# Patient Record
Sex: Male | Born: 1960 | Race: White | Hispanic: No | Marital: Married | Smoking: Never smoker
Health system: Southern US, Community
[De-identification: ages and names within clinical notes are randomized; demographics above are authoritative.]

## PROBLEM LIST (undated history)

## (undated) DIAGNOSIS — Z1211 Encounter for screening for malignant neoplasm of colon: Secondary | ICD-10-CM

## (undated) DIAGNOSIS — H269 Unspecified cataract: Secondary | ICD-10-CM

## (undated) DIAGNOSIS — I2510 Atherosclerotic heart disease of native coronary artery without angina pectoris: Secondary | ICD-10-CM

## (undated) DIAGNOSIS — Z8249 Family history of ischemic heart disease and other diseases of the circulatory system: Secondary | ICD-10-CM

## (undated) DIAGNOSIS — K649 Unspecified hemorrhoids: Secondary | ICD-10-CM

## (undated) DIAGNOSIS — N200 Calculus of kidney: Secondary | ICD-10-CM

## (undated) DIAGNOSIS — K219 Gastro-esophageal reflux disease without esophagitis: Secondary | ICD-10-CM

## (undated) DIAGNOSIS — J302 Other seasonal allergic rhinitis: Secondary | ICD-10-CM

## (undated) DIAGNOSIS — R5381 Other malaise: Secondary | ICD-10-CM

## (undated) DIAGNOSIS — R040 Epistaxis: Secondary | ICD-10-CM

## (undated) DIAGNOSIS — R002 Palpitations: Secondary | ICD-10-CM

## (undated) HISTORY — DX: Encounter for screening for malignant neoplasm of colon: Z12.11

## (undated) HISTORY — DX: Atherosclerotic heart disease of native coronary artery without angina pectoris: I25.10

## (undated) HISTORY — DX: Family history of ischemic heart disease and other diseases of the circulatory system: Z82.49

## (undated) HISTORY — DX: Other malaise: R53.81

## (undated) HISTORY — DX: Unspecified hemorrhoids: K64.9

## (undated) HISTORY — PX: JOINT REPLACEMENT: SHX530

## (undated) HISTORY — DX: Calculus of kidney: N20.0

## (undated) HISTORY — DX: Palpitations: R00.2

## (undated) HISTORY — DX: Gastro-esophageal reflux disease without esophagitis: K21.9

## (undated) HISTORY — DX: Epistaxis: R04.0

## (undated) HISTORY — DX: Unspecified cataract: H26.9

## (undated) HISTORY — PX: CYSTOSCOPY: SUR368

## (undated) HISTORY — PX: CHOLECYSTECTOMY: SHX55

## (undated) HISTORY — DX: Other seasonal allergic rhinitis: J30.2

---

## 2000-03-30 HISTORY — PX: OTHER SURGICAL HISTORY: SHX169

## 2002-12-19 ENCOUNTER — Inpatient Hospital Stay (HOSPITAL_COMMUNITY): Admission: RE | Admit: 2002-12-19 | Discharge: 2002-12-23 | Payer: Self-pay | Admitting: Orthopaedic Surgery

## 2003-03-31 HISTORY — PX: OTHER SURGICAL HISTORY: SHX169

## 2005-01-01 ENCOUNTER — Inpatient Hospital Stay (HOSPITAL_COMMUNITY): Admission: RE | Admit: 2005-01-01 | Discharge: 2005-01-04 | Payer: Self-pay | Admitting: Orthopaedic Surgery

## 2008-11-28 ENCOUNTER — Emergency Department (HOSPITAL_COMMUNITY): Admission: EM | Admit: 2008-11-28 | Discharge: 2008-11-29 | Payer: Self-pay | Admitting: Emergency Medicine

## 2008-12-01 ENCOUNTER — Encounter (INDEPENDENT_AMBULATORY_CARE_PROVIDER_SITE_OTHER): Payer: Self-pay | Admitting: General Surgery

## 2008-12-01 ENCOUNTER — Observation Stay (HOSPITAL_COMMUNITY): Admission: RE | Admit: 2008-12-01 | Discharge: 2008-12-02 | Payer: Self-pay | Admitting: General Surgery

## 2010-01-09 ENCOUNTER — Ambulatory Visit (HOSPITAL_COMMUNITY): Admission: RE | Admit: 2010-01-09 | Discharge: 2010-01-09 | Payer: Self-pay | Admitting: Gastroenterology

## 2010-07-04 LAB — URINALYSIS, ROUTINE W REFLEX MICROSCOPIC
Ketones, ur: 15 mg/dL — AB
Nitrite: NEGATIVE
Specific Gravity, Urine: 1.027 (ref 1.005–1.030)
Urobilinogen, UA: 0.2 mg/dL (ref 0.0–1.0)
pH: 5.5 (ref 5.0–8.0)

## 2010-07-04 LAB — COMPREHENSIVE METABOLIC PANEL
ALT: 20 U/L (ref 0–53)
AST: 21 U/L (ref 0–37)
Albumin: 4 g/dL (ref 3.5–5.2)
Alkaline Phosphatase: 49 U/L (ref 39–117)
GFR calc Af Amer: >60 mL/min (ref >60)
Glucose, Bld: 149 mg/dL — ABNORMAL HIGH (ref 70–99)
Potassium: 4.3 mEq/L (ref 3.5–5.1)
Sodium: 137 mEq/L (ref 135–145)
Total Protein: 6.5 g/dL (ref 6.0–8.3)

## 2010-07-04 LAB — DIFFERENTIAL
Basophils Relative: 0 % (ref 0–1)
Eosinophils Absolute: 0 10*3/uL (ref 0.0–0.7)
Eosinophils Relative: 0 % (ref 0–5)
Lymphs Abs: 0.9 10*3/uL (ref 0.7–4.0)
Monocytes Absolute: 0.4 10*3/uL (ref 0.1–1.0)
Monocytes Relative: 4 % (ref 3–12)
Neutrophils Relative %: 87 % — ABNORMAL HIGH (ref 43–77)

## 2010-07-04 LAB — CBC
Hemoglobin: 14.8 g/dL (ref 13.0–17.0)
RBC: 4.69 MIL/uL (ref 4.22–5.81)
RDW: 12.8 % (ref 11.5–15.5)

## 2010-07-04 LAB — POCT CARDIAC MARKERS: Troponin i, poc: <0.05 ng/mL (ref 0.00–0.09)

## 2010-08-15 NOTE — Discharge Summary (Signed)
NAME:  David Ryan, David Ryan                        ACCOUNT NO.:  [id number]   MEDICAL RECORD NO.:  [id number]                   PATIENT TYPE:  INP   LOCATION:  5024                                 FACILITY:  [facility]   PHYSICIAN:  Lubertha Basque. Jerl Santos, M.D.             DATE OF BIRTH:  1960-09-30   DATE OF ADMISSION:  12/19/2002  DATE OF DISCHARGE:  12/23/2002                                 DISCHARGE SUMMARY   ADMISSION DIAGNOSIS:  Right hip end-stage degenerative joint disease.   DISCHARGE DIAGNOSIS:  Right hip end-stage degenerative joint disease.   OPERATION:  Right total hip replacement.   BRIEF HISTORY:  [name] is a 50 year old male who has had multiple months  of right hip pain. He is know having pain with every step and is having  trouble sleeping at night time, trying to get comfortable. He has tried  various nonsteroidal anti-inflammatory medications; the last one, Naprosyn,  and it has not been of benefit. He has had increasing pain. His x-rays  revealed end-stage DJD and complete destruction of his right hip joint.   PERTINENT LABORATORY DATA AND X-RAY FINDINGS:  His last hemoglobin was 11.5,  hematocrit 33.3. INR 2.0. Serial pro times were done as he was on low dose  Coumadin protocol for prevention.   COURSE IN THE HOSPITAL:  He was admitted postoperatively, put on a PCA pump  for pain control, IV Ancef 1 g q.8h. for three doses. Pharmacy was asked to  help regulate his Coumadin level with an INR greater than 2 and lower than  3. He was also put on Lovenox subcu until his INR was appropriate. Physical  therapy was ordered for touchdown weight-bearing as he had an ingrowth  prosthesis. He had his dressing changed the second day postoperative, and  wound was benign. Progressed well with therapy and really had an  uncomplicated hospital stay. [facility] had been contacted for home health  services which would include pro times and physical therapy. He was  discharged home.   CONDITION ON DISCHARGE:  Improved.   FOLLOW UP:  He will remain on Coumadin for 30 days. He started off on 2.5 mg  daily, and that will be changed as his pro times change.  1. Percocet for pain one or two q.4-6h.  2. Skelaxin 400 mg p.o. q.6-8h. for muscle spasm.   ACTIVITY:  Touch down weight bearing with crutches.   DIET:  Unrestricted.    WOUND CARE:  He may change the dressing every day as desires, and [facility] agency will send a nurse to draw pro times and work  physical therapy.   FOLLOW UP:  He will routine to our office within one week.      [name], P.A.                    Lubertha Basque Jerl Santos, M.D.    MC/MEDQ  D:  01/08/2003  T:  01/08/2003  Job:  [id number]

## 2010-08-15 NOTE — Discharge Summary (Signed)
NAME:  David Ryan, David Ryan              ACCOUNT NO.:  [id number]   MEDICAL RECORD NO.:  [id number]          PATIENT TYPE:  INP   LOCATION:  5010                         FACILITY:  [facility]   PHYSICIAN:  David Basque. Dalldorf, M.D.DATE OF BIRTH:  [birth year]   DATE OF ADMISSION:  01/01/2005  DATE OF DISCHARGE:  01/04/2005                                 DISCHARGE SUMMARY   ADMISSION DIAGNOSIS:  End stage degenerative joint disease left hip.   OPERATION:  Left total hip replacement.   HISTORY OF PRESENT ILLNESS:  David Ryan is a 50 year old white male who is a  patient well known to our practice. He has had his right hip replaced some  time ago and his left hip now is continuing to bother him. He is having pain  with every step and stiffness in the morning and trouble getting up and down  stairs. Trouble sleeping at night time and his x-rays reveal end stage  degenerative joint disease of his left hip.   LABORATORY DATA:  WBC 8.9, hemoglobin 10.5, hematocrit 29.8, platelets  164,000. Serial pro-times, as he was on low-dose Coumadin protocol INR of  3.4, sodium 139, potassium 4.2, glucose 104, BUN 6, creatinine 1.2, calcium  8.1.   HOSPITAL COURSE:  He was admitted postoperatively and was kept on an IV of  lactated ringers 80 cc per hour, morphine PCA pump, Ancef a gram q.8 x3  doses, Colace, Senokot, Trinsicon, laxative of choice all ordered. Coumadin  and Lovenox protocol for DVT prophylaxis per pharmacy. Percocet and  Phenergan by mouth as well for pain control and nausea. Hip precautions.  Incentive spirometry. Knee high ted's. Bed with an overhead frame. Knee  immobilizer to the left knee while in bed. Up and off while walking. CBC, B-  met, and daily pro-times per standing orders x2 days each. Pro-times drawn  serially for DVT prophylaxis protocol per pharmacy. He was ordered to be  touch down weight bearing on that side. The first day postoperative, his  vital signs were stable. His  temperature was 100.2. Blood pressure was  slightly decreased. Lungs were clear. Abdomen was soft. Dressing was dry.  Good neurovascular status. A PCA was holding him. Foley catheter was in and  would be continued until January 03, 2005. Physical therapy was ordered for  touch down weight bearing gait. The second day postoperative, his vital  signs were stable. Temperature was down to 98. Dressing was dry and changed.  Neurovascularly, he was intact with no sign of infection. He continued to  progress well and was discharged home on day 3.   CONDITION ON DISCHARGE:  Improved.   DIET:  No restrictions.   ACTIVITY:  He cannot drive for 2 to 3 weeks. Increase activity slowly. He is  to be partial weight bearing.   DISCHARGE MEDICATIONS:  Prescription for Tylox was given 1 or 2 every 4  hours as needed for pain. Coumadin dose per pharmacy starting off at 3 mg.  Prescription given for Tylox and Coumadin, both upon his discharge.   FOLLOW UP:  Call for an appointment for Dr. Jerl Ryan  in about 10 days at [phone]. If any problems or signs of infection, to call the same number.   SPECIAL INSTRUCTIONS:  Home physical therapy and INR/pro-times were going to  be regulated by [facility].      David Ryan, P.A.      David Ryan, M.D.  Electronically Signed    MC/MEDQ  D:  02/17/2005  T:  02/17/2005  Job:  [id number]

## 2010-08-15 NOTE — Op Note (Signed)
NAME:  NEIKO, TRIVEDI                        ACCOUNT NO.:  [id number]   MEDICAL RECORD NO.:  [id number]                   PATIENT TYPE:  INP   LOCATION:  NA                                   FACILITY:  [facility]   PHYSICIAN:  Lubertha Basque. Jerl Santos, M.D.             DATE OF BIRTH:  [birth year]   DATE OF PROCEDURE:  12/19/2002  DATE OF DISCHARGE:                                 OPERATIVE REPORT   PREOPERATIVE DIAGNOSIS:  Right hip degenerative arthritis.   POSTOPERATIVE DIAGNOSIS:  Right hip degenerative arthritis.   PROCEDURE:  Right total hip replacement.   ANESTHESIA:  General.   SURGEON:  Lubertha Basque. Jerl Santos, M.D.   ASSISTANT:  [name], P.A.   INDICATIONS FOR PROCEDURE:  The patient is a 50 year old man with a long  history of a  very painful right  hip. This has persisted despite various  oral anti-inflammatories and activity restriction. He  has  end-stage  degeneration on x-rays and has pain which limits  his  ability to  sleep and  be active. He  is offered a hip replacement procedure. Informed operative  consent was obtained after a discussion  of possible  complications of  reaction to anesthesia, infection, DVT, PE, dislocation and death.   DESCRIPTION OF PROCEDURE:  The patient was taken to the operating suite  where general anesthetic was applied without difficulty. He was positioned  in the lateral decubitus position  and all bony prominences were  appropriately padded. An axillary roll was placed and hip positioners were  used. He was prepped and draped in the normal sterile fashion. After  administration of preoperative IV antibiotics a posterior approach was taken  to the right hip through  a relatively  small  incision. All appropriate  anti infective measures were used including Betadine impregnated drape,  preoperative IV antibiotic and closed hooded  exhaust systems for each  member of the surgical team.   A dissection was taken through a paucity of  adipose tissue  to expose the IT  band and gluteus maximus fascia. These  were incised longitudinally to  expose the short external rotators of the hip which were tagged and  reflected. A posterior capsulectomy was performed. The hip was dislocated.   A femoral neck cut was made distal  to the lesser trochanter. He had severe  degenerative change but excellent bone quality. Some labral structures were  removed from around the hip followed  by reaming. He had a fairly  large  medial osteophyte in the acetabulum and a small reamer  was used to take  this down to the medial wall. The  reamer set was then  used to  sequentially enlarge the acetabulum to 53 mm. He had good bone which was  bleeding around most of the acetabulum at this point.   A size 54 Pinnacle cup was then placed. This seated fully and was quite  stable. We placed  this in appropriate anteversion and  tilt. A trial liner  was placed. Attention was turned toward the femur. This was reamed and  broached appropriately to accept a size 7 component. A trial reduction was  done with various  components and the high offset plus 5 assembly was felt  to be most stable.   Then the trial femoral component was removed followed  by placement  of a  size 7 high offset Summit femoral  component. This  seated  fully. The  acetabulum was exposed. A hole illuminator was placed followed by placement  of  the Ultramet metal insert. A size 36 plus 5 Articuleze femoral head was  placed on the dry femoral stem which had been placed in appropriate  anteversion.   The hip was then reduced. There was minimal finish shuck. The hip was stable  in extension with external rotation and flexion with internal rotation. Leg  lengths were  judged to be roughly equal. The wound was irrigated, followed  by reapproximation of the short external rotators to the greater  trochanteric area with nonabsorbable suture.   The wound was again irrigated, followed   by closure of the IT band and the  gluteus maximus  fascia with Ethibond and the subcutaneous tissues with 0  and  2-0 undyed Vicryl. The skin was closed with staples followed by  injection with some Marcaine. Adaptic was applied to the wound followed  by  dry gauze and tape. Estimated blood loss was  and interoperative fluids can  be obtained from the  anesthesia records.   Disposition, the patient was extubated  in the operating room and taken to  the recovery room in stable condition. Plans were for him to be admitted to  the orthopedic surgery service for appropriate postoperative  care  to  include perioperative antibiotics and Coumadin  plus Lovenox for DVT  prophylaxis.                                               Lubertha Basque Jerl Santos, M.D.    PGD/MEDQ  D:  12/19/2002  T:  12/19/2002  Job:  [id number]

## 2010-08-15 NOTE — Op Note (Signed)
NAME:  David Ryan, David Ryan              ACCOUNT NO.:  [id number]   MEDICAL RECORD NO.:  [id number]          PATIENT TYPE:  INP   LOCATION:  NA                           FACILITY:  [facility]   PHYSICIAN:  Lubertha Basque. Dalldorf, M.D.DATE OF BIRTH:  [birth year]   DATE OF PROCEDURE:  01/01/2005  DATE OF DISCHARGE:                                 OPERATIVE REPORT   PREOPERATIVE DIAGNOSIS:  Left hip degenerative arthritis.   POSTOPERATIVE DIAGNOSIS:  Left hip degenerative arthritis.   PROCEDURE:  Left total hip replacement.   ANESTHESIA:  General.   ATTENDING SURGEON:  Lubertha Basque. Jerl Santos, M.D.   ASSISTANT:  [name], P.A.   INDICATIONS FOR PROCEDURE:  The patient is a 50 year old man with a long  history of bilateral hip degenerative arthritis.  He is status post  successful hip replacement on the opposite side a couple of years ago and at  this point, has pain which limits his ability to remain active and to rest  on the left side.  He has end-stage degeneration by x-ray.  He has failed  oral anti-inflammatories and he is offered a hip replacement procedure.  Informed operative consent was obtained after discussion of possible  complications of reaction to anesthesia, infection, DVT, dislocation, PE,  and death.   DESCRIPTION OF PROCEDURE:  The patient was taken to an operating suite where  a general anesthetic was applied without difficulty.  He was position in the  lateral decubitus position with left hip up.  All bony prominences were  appropriately padded and an axillary roll was placed.  Hip positioners were  utilized.  He was then prepped and draped in normal sterile fashion.  After  the administration of preop antibiotic, a posterior approach was taken to  the left hip.  All appropriate anti-infective measures were used including  the preoperative IV antibiotic, Betadine-impregnated drape, and closed  hooded exhaust systems for each member of the surgical team.  Dissection was  carried down through a paucity of adipose tissue to the gluteus maximus and  IT band.  These were incised longitudinally to expose the short external  rotators of the hip, which were tagged and reflected.  A posterior  capsulectomy was performed.  The hip was dislocated.  A femoral neck cut was  made just above the lesser trochanter.  The head was severely deformed.  He  had excellent bone quality in the acetabulum and femur.  We medialized with  reaming of the acetabulum to the inner wall and then sequentially reamed out  to 53, followed by placement of a size 54 Pinnacle DePuy cup.  We placed a  trial liner.  Attention was turned towards the femur.  This was reamed and  broached up to a 7, followed by a trial reduction with a 7 offset component  and a +5 head and neck assembly which was equal to the opposite side.  This  seemed a bit too tight and we backed off to a +1.5 head and neck assembly.  This was stable in extension with external rotation and flexion with  internal rotation.  There  was really no shuck.  His leg lengths were felt to  be about equal at this point.  The trial components were removed.  A hole  eliminator was placed in the acetabular shell followed by placement of the  metal liner.  We then placed a size 7 high-offset Summit component and  topped this with a 36 +1.5 metal ball.  The hip again reduced and was stable  in aforementioned positions.  The wound was irrigated, followed by  reapproximation of the short external rotators to the greater trochanteric  region with nonabsorbable suture.  IT band and gluteus maximus fascia were  closed with Vicryl, followed by subcutaneous reapproximation with 0 and 2-0  undyed Vicryl and skin closure with staples.  Adaptic was placed on the  wound followed by dry gauze and tape.  Estimated blood loss and  intraoperative fluids can be obtained from anesthesia records.   DISPOSITION:  The patient was extubated in the operating room  and taken to  the recovery room in stable condition.  Plans were for him to be admitted to  the Orthopedic Surgery Service for appropriate postop care to include  perioperative antibiotics and Coumadin plus Lovenox for DVT prophylaxis.      Lubertha Basque Jerl Santos, M.D.  Electronically Signed     PGD/MEDQ  D:  01/01/2005  T:  01/01/2005  Job:  [id number]

## 2013-05-17 ENCOUNTER — Encounter (HOSPITAL_COMMUNITY): Payer: Self-pay | Admitting: Emergency Medicine

## 2013-05-17 ENCOUNTER — Emergency Department (HOSPITAL_COMMUNITY)
Admission: EM | Admit: 2013-05-17 | Discharge: 2013-05-17 | Disposition: A | Discharge status: Home / Self Care | Payer: BC Managed Care – PPO | Attending: Emergency Medicine | Admitting: Emergency Medicine

## 2013-05-17 DIAGNOSIS — Z8679 Personal history of other diseases of the circulatory system: Secondary | ICD-10-CM | POA: Insufficient documentation

## 2013-05-17 DIAGNOSIS — R197 Diarrhea, unspecified: Secondary | ICD-10-CM | POA: Insufficient documentation

## 2013-05-17 DIAGNOSIS — R112 Nausea with vomiting, unspecified: Secondary | ICD-10-CM | POA: Insufficient documentation

## 2013-05-17 DIAGNOSIS — Z79899 Other long term (current) drug therapy: Secondary | ICD-10-CM | POA: Insufficient documentation

## 2013-05-17 DIAGNOSIS — Z9089 Acquired absence of other organs: Secondary | ICD-10-CM | POA: Insufficient documentation

## 2013-05-17 LAB — CBC WITH DIFFERENTIAL/PLATELET
BASOS PCT: 0 % (ref 0–1)
Basophils Absolute: 0 10*3/uL (ref 0.0–0.1)
EOS ABS: 0 10*3/uL (ref 0.0–0.7)
Eosinophils Relative: 0 % (ref 0–5)
HCT: 45.2 % (ref 39.0–52.0)
Hemoglobin: 16 g/dL (ref 13.0–17.0)
Lymphocytes Relative: 5 % — ABNORMAL LOW (ref 12–46)
Lymphs Abs: 0.5 10*3/uL — ABNORMAL LOW (ref 0.7–4.0)
MCH: 32 pg (ref 26.0–34.0)
MCHC: 35.4 g/dL (ref 30.0–36.0)
MCV: 90.4 fL (ref 78.0–100.0)
MONOS PCT: 4 % (ref 3–12)
Monocytes Absolute: 0.4 10*3/uL (ref 0.1–1.0)
NEUTROS PCT: 92 % — AB (ref 43–77)
Neutro Abs: 9.8 10*3/uL — ABNORMAL HIGH (ref 1.7–7.7)
PLATELETS: 236 10*3/uL (ref 150–400)
RBC: 5 MIL/uL (ref 4.22–5.81)
RDW: 12.6 % (ref 11.5–15.5)
WBC: 10.7 10*3/uL — ABNORMAL HIGH (ref 4.0–10.5)

## 2013-05-17 LAB — COMPREHENSIVE METABOLIC PANEL
ALBUMIN: 4.2 g/dL (ref 3.5–5.2)
ALK PHOS: 57 U/L (ref 39–117)
ALT: 15 U/L (ref 0–53)
AST: 20 U/L (ref 0–37)
BUN: 21 mg/dL (ref 6–23)
CO2: 23 mEq/L (ref 19–32)
Calcium: 9.6 mg/dL (ref 8.4–10.5)
Chloride: 106 mEq/L (ref 96–112)
Creatinine, Ser: 1.39 mg/dL — ABNORMAL HIGH (ref 0.50–1.35)
GFR calc Af Amer: 65 mL/min — ABNORMAL LOW (ref >90)
GFR calc non Af Amer: 56 mL/min — ABNORMAL LOW (ref >90)
Glucose, Bld: 143 mg/dL — ABNORMAL HIGH (ref 70–99)
POTASSIUM: 4.5 meq/L (ref 3.7–5.3)
SODIUM: 143 meq/L (ref 137–147)
TOTAL PROTEIN: 6.9 g/dL (ref 6.0–8.3)
Total Bilirubin: 1.1 mg/dL (ref 0.3–1.2)

## 2013-05-17 LAB — LIPASE, BLOOD: LIPASE: 24 U/L (ref 11–59)

## 2013-05-17 MED ORDER — ONDANSETRON 4 MG PO TBDP: 4.0000 mg | ORAL_TABLET | Freq: Three times a day (TID) | ORAL | Status: DC | PRN

## 2013-05-17 MED ORDER — MORPHINE SULFATE 4 MG/ML IJ SOLN
4.0000 mg | Freq: Once | INTRAMUSCULAR | Status: AC
  Administered 2013-05-17: 4 mg via INTRAVENOUS
  Filled 2013-05-17: qty 1

## 2013-05-17 MED ORDER — DICYCLOMINE HCL 20 MG PO TABS: 20.0000 mg | ORAL_TABLET | Freq: Two times a day (BID) | ORAL | Status: DC

## 2013-05-17 MED ORDER — METOCLOPRAMIDE HCL 5 MG/ML IJ SOLN
10.0000 mg | INTRAMUSCULAR | Status: AC
  Administered 2013-05-17: 10 mg via INTRAVENOUS
  Filled 2013-05-17: qty 2

## 2013-05-17 MED ORDER — SODIUM CHLORIDE 0.9 % IV BOLUS (SEPSIS)
1000.0000 mL | Freq: Once | INTRAVENOUS | Status: AC
  Administered 2013-05-17: 1000 mL via INTRAVENOUS

## 2013-05-17 NOTE — ED Notes (Signed)
Per EMS pain c/o of lower abdominal pain; Pain was sudden w/ diarrhea and n/v; Pt took meds for nausea with no relief. Diarrhea x 8 and n/v x 6 since 4:30pm last evening. Pt has hx hip replacement x 2 and gall bladder removed.   Hx of a-fib untreated but monitored. Pt received Zofran 4mg  and 250 bolus n/s.

## 2013-05-17 NOTE — ED Provider Notes (Signed)
CSN: [id number]     Arrival date & time 05/17/13  0015 History   First MD Initiated Contact with Patient 05/17/13 0025     Chief Complaint  Patient presents with  . Abdominal Pain     (Consider location/radiation/quality/duration/timing/severity/associated sxs/prior Treatment) Patient is a 53 y.o. male presenting with abdominal pain. The history is provided by the patient and medical records.  Abdominal Pain Associated symptoms: diarrhea, nausea and vomiting    This is a 53 year old male with past medical history significant for A. fib, presenting to the ED via EMS for abdominal pain, nausea, vomiting, and diarrhea onset 1600 yesterday evening.  Pain described as a generalized abdominal discomfort.  Has had approx 6 episodes of non-bloody, non-bilious emesis, and 8 episodes of non-bloody, watery diarrhea.  Patient denies any fevers or chills. No recent sick contacts with similar symptoms. Patient ate chili for dinner last night and for lunch today-- states no one else that ate the chili got sick.  Also states he did eat a homemade doughnut earlier this morning that had been sitting on the kitchen counter for several days.  Patient admits to routine alcohol use, less than 6 beers daily. Last PO intake attempted around 1300 today.  Pain initially rated 8/10, improved to 2/10 after zofran and fentanyl en route by EMS.  VS stable on arrival to ED.  Past Medical History  Diagnosis Date  . A-fib    Past Surgical History  Procedure Laterality Date  . Cholecystectomy    . Joint replacement     History reviewed. No pertinent family history. History  Substance Use Topics  . Smoking status: Never Smoker   . Smokeless tobacco: Not on file  . Alcohol Use: Yes     Comment: 2-3 beers a day     Review of Systems  Gastrointestinal: Positive for nausea, vomiting, abdominal pain and diarrhea.  All other systems reviewed and are negative.      Allergies  Review of patient's allergies indicates  no known allergies.  Home Medications   Current Outpatient Rx  Name  Route  Sig  Dispense  Refill  . guaiFENesin (MUCINEX) 600 MG 12 hr tablet   Oral   Take by mouth 2 (two) times daily as needed for to loosen phlegm.         Marland Kitchen ibuprofen (ADVIL,MOTRIN) 200 MG tablet   Oral   Take 200 mg by mouth every 6 (six) hours as needed for mild pain.         . pseudoephedrine (SUDAFED) 30 MG tablet   Oral   Take 30 mg by mouth every 4 (four) hours as needed for congestion.          BP 110/67  Pulse 81  Temp(Src) 98.3 F (36.8 C) (Oral)  Resp 20  SpO2 98%  Physical Exam  Nursing note and vitals reviewed. Constitutional: He is oriented to person, place, and time. He appears well-developed and well-nourished.  Appears tired  HENT:  Head: Normocephalic and atraumatic.  Mouth/Throat: Oropharynx is clear and moist.  Mucous membranes dry  Eyes: Conjunctivae and EOM are normal. Pupils are equal, round, and reactive to light.  Neck: Normal range of motion.  Cardiovascular: Normal rate, regular rhythm and normal heart sounds.   Pulmonary/Chest: Effort normal and breath sounds normal. No respiratory distress. He has no wheezes.  Abdominal: Soft. Bowel sounds are normal. There is no tenderness. There is no guarding.  Abdomen soft, nondistended, no peritoneal signs  Musculoskeletal: Normal range  of motion.  Neurological: He is alert and oriented to person, place, and time.  Skin: Skin is warm and dry.  Psychiatric: He has a normal mood and affect.    ED Course  Procedures (including critical care time) Labs Review Labs Reviewed  CBC WITH DIFFERENTIAL - Abnormal; Notable for the following:    WBC 10.7 (*)    Neutrophils Relative % 92 (*)    Neutro Abs 9.8 (*)    Lymphocytes Relative 5 (*)    Lymphs Abs 0.5 (*)    All other components within normal limits  COMPREHENSIVE METABOLIC PANEL - Abnormal; Notable for the following:    Glucose, Bld 143 (*)    Creatinine, Ser 1.39 (*)     GFR calc non Af Amer 56 (*)    GFR calc Af Amer 65 (*)    All other components within normal limits  LIPASE, BLOOD   Imaging Review No results found.  EKG Interpretation   None       MDM   Final diagnoses:  Nausea, vomiting and diarrhea   On initial evaluation, patient is afebrile and overall nontoxic appearing. His abdominal exam is benign, his mucous membranes are dry he does appear dehydrated. Will obtain basic labs, rehydrate, and reassess.  Labs reassuring.  Pt states he feels better after IVF and meds.  Repeat abdominal exam is unchanged.  He has tolerated PO without difficulty.  Pt afebrile, non-toxic appearing, NAD, VS stable- ok for discharge.  Rx zofran and bentyl.  Encouraged to drink fluids, start with bland diet and progress as tolerated.  FU with PCP if problems occur.  Discussed plan with pt, they agreed.  Return precautions advised.  Garlon HatchetLisa M Genecis Veley, PA-C 05/17/13 [phone]

## 2013-05-17 NOTE — Discharge Instructions (Signed)
Take the prescribed medication as directed.  Continue drinking plenty of fluids to keep yourself hydrated.  May wish to start with bland diet and progress as tolerated. Follow-up with your primary care physician if problems occur. Return to the ED for new or worsening symptoms.

## 2013-05-17 NOTE — ED Notes (Signed)
PA at bedside.

## 2013-05-17 NOTE — ED Provider Notes (Signed)
Medical screening examination/treatment/procedure(s) were performed by non-physician practitioner and as supervising physician I was immediately available for consultation/collaboration.    Olivia Mackielga M Diago Haik, MD 05/17/13 [phone]

## 2014-01-02 DIAGNOSIS — K219 Gastro-esophageal reflux disease without esophagitis: Secondary | ICD-10-CM | POA: Insufficient documentation

## 2014-01-02 DIAGNOSIS — N200 Calculus of kidney: Secondary | ICD-10-CM | POA: Insufficient documentation

## 2014-01-02 DIAGNOSIS — J302 Other seasonal allergic rhinitis: Secondary | ICD-10-CM | POA: Insufficient documentation

## 2014-01-02 DIAGNOSIS — R002 Palpitations: Secondary | ICD-10-CM | POA: Insufficient documentation

## 2018-06-07 ENCOUNTER — Ambulatory Visit: Payer: Self-pay | Admitting: Cardiology

## 2018-07-29 ENCOUNTER — Telehealth: Payer: Self-pay

## 2018-07-29 NOTE — Telephone Encounter (Signed)
Virtual Visit Pre-Appointment Phone Call  "(Name), I am calling you today to discuss your upcoming appointment. We are currently trying to limit exposure to the virus that causes COVID-19 by seeing patients at home rather than in the office."  1. "What is the BEST phone number to call the day of the visit?" - include this in appointment notes  2. "Do you have or have access to (through a family member/friend) a smartphone with video capability that we can use for your visit?" a. If yes - list this number in appt notes as "cell" (if different from BEST phone #) and list the appointment type as a VIDEO visit in appointment notes b. If no - list the appointment type as a PHONE visit in appointment notes  3. Confirm consent - "In the setting of the current Covid19 crisis, you are scheduled for a (phone or video) visit with your provider on (date) at (time).  Just as we do with many in-office visits, in order for you to participate in this visit, we must obtain consent.  If you'd like, I can send this to your mychart (if signed up) or email for you to review.  Otherwise, I can obtain your verbal consent now.  All virtual visits are billed to your insurance company just like a normal visit would be.  By agreeing to a virtual visit, we'd like you to understand that the technology does not allow for your provider to perform an examination, and thus may limit your provider's ability to fully assess your condition. If your provider identifies any concerns that need to be evaluated in person, we will make arrangements to do so.  Finally, though the technology is pretty good, we cannot assure that it will always work on either your or our end, and in the setting of a video visit, we may have to convert it to a phone-only visit.  In either situation, we cannot ensure that we have a secure connection.  Are you willing to proceed?" STAFF: Did the patient verbally acknowledge consent to telehealth visit? Document  YES/NO here: YES  4. Advise patient to be prepared - "Two hours prior to your appointment, go ahead and check your blood pressure, pulse, oxygen saturation, and your weight (if you have the equipment to check those) and write them all down. When your visit starts, your provider will ask you for this information. If you have an Apple Watch or Kardia device, please plan to have heart rate information ready on the day of your appointment. Please have a pen and paper handy nearby the day of the visit as well."  5. Give patient instructions for MyChart download to smartphone OR Doximity/Doxy.me as below if video visit (depending on what platform provider is using)  6. Inform patient they will receive a phone call 15 minutes prior to their appointment time (may be from unknown caller ID) so they should be prepared to answer    TELEPHONE CALL NOTE  David Ryan has been deemed a candidate for a follow-up tele-health visit to limit community exposure during the Covid-19 pandemic. I spoke with the patient via phone to ensure availability of phone/video source, confirm preferred email & phone number, and discuss instructions and expectations.  I reminded David Ryan to be prepared with any vital sign and/or heart rhythm information that could potentially be obtained via home monitoring, at the time of his visit. I reminded David Ryan to expect a phone call prior to  his visit.  Dustin FlockBen G Aanvi Voyles, RN 07/29/2018 5:40 PM   INSTRUCTIONS FOR DOWNLOADING THE MYCHART APP TO SMARTPHONE  - The patient must first make sure to have activated MyChart and know their login information - If Apple, go to [employer] Store and type in MyChart in the search bar and download the app. If Android, ask patient to go to [employer] Play Store and type in [place] in the search bar and download the app. The app is free but as with any other app downloads, their phone may require them to verify saved payment  information or Apple/Android password.  - The patient will need to then log into the app with their MyChart username and password, and select [facility] as their healthcare provider to link the account. When it is time for your visit, go to the MyChart app, find appointments, and click Begin Video Visit. Be sure to Select Allow for your device to access the Microphone and Camera for your visit. You will then be connected, and your provider will be with you shortly.  **If they have any issues connecting, or need assistance please contact MyChart service desk [phone])**  **If using a computer, in order to ensure the best quality for their visit they will need to use either of the following Internet Browsers: [employer] Edge, or Google Chrome**  IF USING DOXIMITY or DOXY.ME - The patient will receive a link just prior to their visit by text.     FULL LENGTH CONSENT FOR TELE-HEALTH VISIT   I hereby voluntarily request, consent and authorize [facility] and its employed or contracted physicians, physician assistants, nurse practitioners or other licensed health care professionals (the Practitioner), to provide me with telemedicine health care services (the "Services") as deemed necessary by the treating Practitioner. I acknowledge and consent to receive the Services by the Practitioner via telemedicine. I understand that the telemedicine visit will involve communicating with the Practitioner through live audiovisual communication technology and the disclosure of certain medical information by electronic transmission. I acknowledge that I have been given the opportunity to request an in-person assessment or other available alternative prior to the telemedicine visit and am voluntarily participating in the telemedicine visit.  I understand that I have the right to withhold or withdraw my consent to the use of telemedicine in the course of my care at any time, without affecting my right  to future care or treatment, and that the Practitioner or I may terminate the telemedicine visit at any time. I understand that I have the right to inspect all information obtained and/or recorded in the course of the telemedicine visit and may receive copies of available information for a reasonable fee.  I understand that some of the potential risks of receiving the Services via telemedicine include:  Marland Kitchen. Delay or interruption in medical evaluation due to technological equipment failure or disruption; . Information transmitted may not be sufficient (e.g. poor resolution of images) to allow for appropriate medical decision making by the Practitioner; and/or  . In rare instances, security protocols could fail, causing a breach of personal health information.  Furthermore, I acknowledge that it is my responsibility to provide information about my medical history, conditions and care that is complete and accurate to the best of my ability. I acknowledge that Practitioner's advice, recommendations, and/or decision may be based on factors not within their control, such as incomplete or inaccurate data provided by me or distortions of diagnostic images or specimens that may result from electronic transmissions.  I understand that the practice of medicine is not an exact science and that Practitioner makes no warranties or guarantees regarding treatment outcomes. I acknowledge that I will receive a copy of this consent concurrently upon execution via email to the email address I last provided but may also request a printed copy by calling the office of [place].    I understand that my insurance will be billed for this visit.   I have read or had this consent read to me. . I understand the contents of this consent, which adequately explains the benefits and risks of the Services being provided via telemedicine.  . I have been provided ample opportunity to ask questions regarding this consent and the Services  and have had my questions answered to my satisfaction. . I give my informed consent for the services to be provided through the use of telemedicine in my medical care  By participating in this telemedicine visit I agree to the above.

## 2018-08-01 ENCOUNTER — Ambulatory Visit: Payer: Self-pay | Admitting: Cardiology

## 2018-08-01 ENCOUNTER — Encounter: Payer: Self-pay | Admitting: Cardiology

## 2018-08-01 ENCOUNTER — Telehealth (INDEPENDENT_AMBULATORY_CARE_PROVIDER_SITE_OTHER): Payer: Self-pay | Admitting: Cardiology

## 2018-08-01 ENCOUNTER — Other Ambulatory Visit: Payer: Self-pay

## 2018-08-01 VITALS — Ht 68.0 in | Wt 189.0 lb

## 2018-08-01 DIAGNOSIS — R002 Palpitations: Secondary | ICD-10-CM

## 2018-08-01 DIAGNOSIS — R0602 Shortness of breath: Secondary | ICD-10-CM

## 2018-08-01 DIAGNOSIS — R7303 Prediabetes: Secondary | ICD-10-CM

## 2018-08-01 DIAGNOSIS — I10 Essential (primary) hypertension: Secondary | ICD-10-CM

## 2018-08-01 DIAGNOSIS — Z7189 Other specified counseling: Secondary | ICD-10-CM

## 2018-08-01 DIAGNOSIS — Z8249 Family history of ischemic heart disease and other diseases of the circulatory system: Secondary | ICD-10-CM

## 2018-08-01 NOTE — Progress Notes (Signed)
Virtual Visit via Video Note   This visit type was conducted due to national recommendations for restrictions regarding the COVID-19 Pandemic (e.g. social distancing) in an effort to limit this patient's exposure and mitigate transmission in our community.  Due to his co-morbid illnesses, this patient is at least at moderate risk for complications without adequate follow up.  This format is felt to be most appropriate for this patient at this time.  All issues noted in this document were discussed and addressed.  A limited physical exam was performed with this format.  Please refer to the patient's chart for his consent to telehealth for [facility].   Evaluation Performed:  Cardiology consult  This visit type was conducted due to national recommendations for restrictions regarding the COVID-19 Pandemic (e.g. social distancing).  This format is felt to be most appropriate for this patient at this time.  All issues noted in this document were discussed and addressed.  No physical exam was performed (except for noted visual exam findings with Video Visits).  Please refer to the patient's chart (MyChart message for video visits and phone note for telephone visits) for the patient's consent to telehealth for [facility].  Date:  08/01/2018   ID:  David Ryan, DOB Jun 20, 1960, MRN [id number]  Patient Location:  Home  Provider location:   [place]  PCP:  David Dills, MD  Cardiologist:  NEW Electrophysiologist:  None   Chief Complaint:  Evaluation due to family hx of CAD  History of Present Illness:    David Ryan is a 58 y.o. male who presents via audio/video conferencing for a telehealth visit today in referral by Dr. Renford Ryan due to family hx of CAD.  This is a 58yo male with a history of GERD who is referred due to a family hx of CAD to assess cardiac risk.  The patient has a history of palpitations in the past and apparently wore a heart monitor at  one point but nothing really showed up.  He says over time his palpitations have gotten much better with treatment of anxiety and stress.  He limits caffeine use.  He denies any chest pain or pressure.  Occasionally he will have some mild shortness of breath when he first starts heavy exertion but within 10 minutes that resolves and he is able to continue with his heavy exertion or exercise.  He denies any PND, orthopnea, lower extremity edema, dizziness or syncope.  He does not smoke but was exposed to secondhand smoke with both his parents.  The patient does not have symptoms concerning for COVID-19 infection (fever, chills, cough, or new shortness of breath).    Prior CV studies:   The following studies were reviewed today:  none  Past Medical History:  Diagnosis Date   Cataracts, bilateral    Esophageal reflux    Family history of early CAD    Heart palpitations    Malaise    Nephrolithiasis    Recurrent epistaxis    Seasonal allergies    Special screening for malignant neoplasm of colon    Unspecified hemorrhoids    Past Surgical History:  Procedure Laterality Date   CHOLECYSTECTOMY     CYSTOSCOPY N/A    JOINT REPLACEMENT     left hip replacement  2005   right hip replacement Right 2002     Current Meds  Medication Sig   guaiFENesin (MUCINEX) 600 MG 12 hr tablet Take by mouth 2 (two) times daily as needed  for to loosen phlegm.   ibuprofen (ADVIL,MOTRIN) 200 MG tablet Take 200 mg by mouth every 6 (six) hours as needed for mild pain.   omeprazole (PRILOSEC) 20 MG capsule Take 20 mg by mouth as needed.    pseudoephedrine (SUDAFED) 30 MG tablet Take 30 mg by mouth every 4 (four) hours as needed for congestion.     Allergies:   Patient has no known allergies.   Social History   Tobacco Use   Smoking status: Never Smoker  Substance Use Topics   Alcohol use: Yes    Comment: 2-3 beers a day    Drug use: No     Family Hx: The patient's family  history includes Coronary artery disease in his father; Emphysema in his mother; Healthy in his daughter and son; Heart disease in his father.  ROS:   Please see the history of present illness.     All other systems reviewed and are negative.   Labs/Other Tests and Data Reviewed:    Recent Labs: No results found for requested labs within last 8760 hours.   Recent Lipid Panel No results found for: CHOL, TRIG, HDL, CHOLHDL, LDLCALC, LDLDIRECT  Wt Readings from Last 3 Encounters:  08/01/18 189 lb (85.7 kg)     Objective:    Vital Signs:  Ht 5\' 8"  (1.727 m)    Wt 189 lb (85.7 kg)    BMI 28.74 kg/m    CONSTITUTIONAL:  Well nourished, well developed male in no acute distress.  EYES: anicteric MOUTH: oral mucosa is pink RESPIRATORY: Normal respiratory effort, symmetric expansion CARDIOVASCULAR: No peripheral edema SKIN: No rash, lesions or ulcers MUSCULOSKELETAL: no digital cyanosis NEURO: Cranial Nerves II-XII grossly intact, moves all extremities PSYCH: Intact judgement and insight.  A&O x 3, Mood/affect appropriate   ASSESSMENT & PLAN:    1.  Family history of premature CAD -his father had CAD with CABG early in life.  The patient denies any chest pain or pressure although he does notice when he first starts to exercise epidural strenuous exercise that he may have some mild shortness of breath but that quickly resolves within 10 minutes and he is able to continue his exercise.  I do not have an EKG today to review.  I recommended that we get a Lexiscan Myoview as we are trying to avoid exercise treadmill test for now in the setting of COVID-19.  This will help assess risk.  I have also recommended a chest CT for calcium score to help assess future risk as well.  His last LDL was 105 in February.  He does not smoke.  2.  Palpitations -he has had these for years occurring maybe once a month but once he learned how to control stress in his life these have improved.    3.  COVID-19  Education:The signs and symptoms of COVID-19 were discussed with the patient and how to seek care for testing (follow up with PCP or arrange E-visit).  The importance of social distancing was discussed today.  Patient Risk:   After full review of this patient's clinical status, I feel that they are at least moderate risk at this time.  Time:   Today, I have spent 25 minutes directly with the patient on video discussing medical problems including Chest pain.  We also reviewed the symptoms of COVID 19 and the ways to protect against contracting the virus with telehealth technology.  I spent an additional 5 minutes reviewing patient's chart including office notes.  Medication Adjustments/Labs and Tests Ordered: Current medicines are reviewed at length with the patient today.  Concerns regarding medicines are outlined above.  Tests Ordered: No orders of the defined types were placed in this encounter.  Medication Changes: No orders of the defined types were placed in this encounter.   Disposition:  Follow up prn  Signed, Armanda Magic, MD  08/01/2018 10:22 AM    [facility]

## 2018-08-01 NOTE — Patient Instructions (Signed)
Medication Instructions:  Your provider recommends that you continue on your current medications as directed. Please refer to the Current Medication list given to you today.    Testing/Procedures: Your provider has requested that you have a lexiscan myoview. For further information please visit [email or web address] Please follow instruction sheet, as given.  Dr. Mayford Knife recommends you have a CT for CALCIUM SCORE.  Follow-Up: Your provider recommends that you schedule a follow-up appointment AS NEEDED with Dr. Mayford Knife pending study results.

## 2018-08-01 NOTE — Addendum Note (Signed)
Addended by: Gunnar Fusi A on: 08/01/2018 12:50 PM   Modules accepted: Orders

## 2018-08-15 ENCOUNTER — Ambulatory Visit: Payer: Self-pay | Admitting: Cardiology

## 2018-08-18 ENCOUNTER — Encounter: Payer: Self-pay | Admitting: Cardiology

## 2018-08-29 ENCOUNTER — Telehealth (HOSPITAL_COMMUNITY): Payer: Self-pay

## 2018-08-29 NOTE — Telephone Encounter (Signed)
Spoke with the patient he stated that he understood and would be there. S.Kiley Torrence EMTP

## 2018-08-31 ENCOUNTER — Ambulatory Visit
Admission: RE | Admit: 2018-08-31 | Discharge: 2018-08-31 | Disposition: A | Discharge status: Home / Self Care | Payer: Self-pay | Source: Ambulatory Visit | Attending: Cardiology | Admitting: Cardiology

## 2018-08-31 ENCOUNTER — Other Ambulatory Visit: Payer: Self-pay

## 2018-08-31 ENCOUNTER — Ambulatory Visit (HOSPITAL_COMMUNITY): Payer: Managed Care, Other (non HMO) | Attending: Cardiology

## 2018-08-31 DIAGNOSIS — Z8249 Family history of ischemic heart disease and other diseases of the circulatory system: Secondary | ICD-10-CM | POA: Diagnosis present

## 2018-08-31 DIAGNOSIS — R0602 Shortness of breath: Secondary | ICD-10-CM | POA: Diagnosis not present

## 2018-08-31 DIAGNOSIS — R002 Palpitations: Secondary | ICD-10-CM | POA: Diagnosis present

## 2018-08-31 LAB — MYOCARDIAL PERFUSION IMAGING
LV dias vol: 90 mL (ref 62–150)
LV sys vol: 33 mL
Peak HR: 80 {beats}/min
Rest HR: 60 {beats}/min
SDS: 2
SRS: 2
SSS: 4
TID: 0.93

## 2018-08-31 MED ORDER — TECHNETIUM TC 99M TETROFOSMIN IV KIT
32.7000 | PACK | Freq: Once | INTRAVENOUS | Status: AC | PRN
  Administered 2018-08-31: 32.7 via INTRAVENOUS
  Filled 2018-08-31: qty 33

## 2018-08-31 MED ORDER — TECHNETIUM TC 99M TETROFOSMIN IV KIT
10.9000 | PACK | Freq: Once | INTRAVENOUS | Status: AC | PRN
  Administered 2018-08-31: 10.9 via INTRAVENOUS
  Filled 2018-08-31: qty 11

## 2018-08-31 MED ORDER — REGADENOSON 0.4 MG/5ML IV SOLN
0.4000 mg | Freq: Once | INTRAVENOUS | Status: AC
  Administered 2018-08-31: 0.4 mg via INTRAVENOUS

## 2018-09-01 ENCOUNTER — Encounter (HOSPITAL_COMMUNITY): Payer: Self-pay | Admitting: Cardiology

## 2018-09-01 DIAGNOSIS — I2510 Atherosclerotic heart disease of native coronary artery without angina pectoris: Secondary | ICD-10-CM | POA: Insufficient documentation

## 2018-09-08 ENCOUNTER — Telehealth: Payer: Self-pay | Admitting: Cardiology

## 2018-09-08 NOTE — Telephone Encounter (Signed)
Notes recorded by Sueanne Margarita, MD on 09/01/2018 at 8:20 PM EDT  Please let patient know that he has evidence of coronary artery calcifications in the LAD with an elevated calcium score. Please have patient come in for FLP and ALT as he should be on statin therapy  ------   Notes recorded by Sueanne Margarita, MD on 08/31/2018 at 5:04 PM EDT  Normal noncardiac portion of chest CT

## 2018-09-08 NOTE — Telephone Encounter (Signed)
Spoke with the patient, he had his current labs faxed from his PCP office.

## 2018-09-08 NOTE — Telephone Encounter (Signed)
Patient returned called for test results.  °

## 2018-09-21 ENCOUNTER — Telehealth: Payer: Self-pay

## 2018-09-21 DIAGNOSIS — E7800 Pure hypercholesterolemia, unspecified: Secondary | ICD-10-CM

## 2018-09-21 MED ORDER — ATORVASTATIN CALCIUM 10 MG PO TABS: 10.0000 mg | ORAL_TABLET | Freq: Every day | ORAL | 3 refills | Status: DC

## 2018-09-21 NOTE — Telephone Encounter (Signed)
-----   Message from Sueanne Margarita, MD sent at 09/08/2018  8:01 PM EDT ----- LDL was 105 on recent labs and LDL goal is < 70 due to coronary calcifications.  Please start Lipitor 10mg  daily and repeat FLp and ALT in 6 weeks

## 2018-09-21 NOTE — Telephone Encounter (Signed)
Spoke with the patient, he expressed understanding about starting Lipitor and is having repeat labs on 11/07/18.

## 2018-11-07 ENCOUNTER — Other Ambulatory Visit: Payer: Managed Care, Other (non HMO) | Admitting: *Deleted

## 2018-11-07 ENCOUNTER — Other Ambulatory Visit: Payer: Self-pay

## 2018-11-07 DIAGNOSIS — E7800 Pure hypercholesterolemia, unspecified: Secondary | ICD-10-CM

## 2018-11-08 LAB — HEPATIC FUNCTION PANEL
ALT: 20 IU/L (ref 0–44)
AST: 21 IU/L (ref 0–40)
Albumin: 4.3 g/dL (ref 3.8–4.9)
Alkaline Phosphatase: 59 IU/L (ref 39–117)
Bilirubin Total: 0.7 mg/dL (ref 0.0–1.2)
Bilirubin, Direct: 0.2 mg/dL (ref 0.00–0.40)
Total Protein: 6.2 g/dL (ref 6.0–8.5)

## 2018-11-08 LAB — LIPID PANEL
Chol/HDL Ratio: 2.8 ratio (ref 0.0–5.0)
Cholesterol, Total: 155 mg/dL (ref 100–199)
HDL: 55 mg/dL (ref >39)
LDL Calculated: 85 mg/dL (ref 0–99)
Triglycerides: 74 mg/dL (ref 0–149)
VLDL Cholesterol Cal: 15 mg/dL (ref 5–40)

## 2018-11-10 ENCOUNTER — Telehealth: Payer: Self-pay

## 2018-11-10 DIAGNOSIS — E785 Hyperlipidemia, unspecified: Secondary | ICD-10-CM

## 2018-11-10 NOTE — Telephone Encounter (Signed)
-----   Message from Sueanne Margarita, MD sent at 11/08/2018  9:39 AM EDT ----- LDL goal is < 70 and his was 85.  Increase lipitor to 20mg  daily and check FLP and ALT in 6 weeks

## 2018-11-10 NOTE — Telephone Encounter (Signed)
Pt agrees to have repeat lipids and hepatic in 6 weeks.  He states he has not started his Lipitor 10mg  yet. I encouraged him to begin today since he is not yet at goal. He is worried about side effects. I educated him on his dose should only have minimal side effects, if any. He has agreed to begin his lipitor and repeat labs in 6 weeks.

## 2018-12-26 ENCOUNTER — Other Ambulatory Visit: Payer: Managed Care, Other (non HMO) | Admitting: *Deleted

## 2018-12-26 ENCOUNTER — Other Ambulatory Visit: Payer: Self-pay

## 2018-12-26 DIAGNOSIS — E785 Hyperlipidemia, unspecified: Secondary | ICD-10-CM

## 2018-12-27 ENCOUNTER — Telehealth: Payer: Self-pay

## 2018-12-27 LAB — HEPATIC FUNCTION PANEL
ALT: 20 IU/L (ref 0–44)
AST: 27 IU/L (ref 0–40)
Albumin: 4.5 g/dL (ref 3.8–4.9)
Alkaline Phosphatase: 67 IU/L (ref 39–117)
Bilirubin Total: 0.9 mg/dL (ref 0.0–1.2)
Bilirubin, Direct: 0.31 mg/dL (ref 0.00–0.40)
Total Protein: 6.1 g/dL (ref 6.0–8.5)

## 2018-12-27 LAB — LIPID PANEL
Chol/HDL Ratio: 2 ratio (ref 0.0–5.0)
Cholesterol, Total: 126 mg/dL (ref 100–199)
HDL: 62 mg/dL (ref >39)
LDL Chol Calc (NIH): 53 mg/dL (ref 0–99)
Triglycerides: 43 mg/dL (ref 0–149)
VLDL Cholesterol Cal: 11 mg/dL (ref 5–40)

## 2018-12-27 NOTE — Telephone Encounter (Signed)
-----   Message from Sueanne Margarita, MD sent at 12/27/2018  7:42 AM EDT ----- Lipids at goal continue current therapy and forward to PCP

## 2018-12-27 NOTE — Telephone Encounter (Signed)
Notes recorded by Frederik Schmidt, RN on 12/27/2018 at 8:42 AM EDT  The patient has been notified of the result and verbalized understanding. All questions (if any) were answered.  Frederik Schmidt, RN 12/27/2018 8:42 AM   ------

## 2019-01-04 ENCOUNTER — Other Ambulatory Visit: Payer: Self-pay

## 2019-01-04 DIAGNOSIS — Z20822 Contact with and (suspected) exposure to covid-19: Secondary | ICD-10-CM

## 2019-01-06 LAB — NOVEL CORONAVIRUS, NAA: SARS-CoV-2, NAA: NOT DETECTED

## 2019-02-01 ENCOUNTER — Other Ambulatory Visit: Payer: Self-pay

## 2019-02-01 DIAGNOSIS — Z20822 Contact with and (suspected) exposure to covid-19: Secondary | ICD-10-CM

## 2019-02-02 LAB — NOVEL CORONAVIRUS, NAA: SARS-CoV-2, NAA: NOT DETECTED

## 2019-09-22 ENCOUNTER — Other Ambulatory Visit: Payer: Self-pay | Admitting: Cardiology

## 2020-01-02 ENCOUNTER — Other Ambulatory Visit: Payer: Self-pay | Admitting: Cardiology

## 2020-01-16 ENCOUNTER — Other Ambulatory Visit: Payer: Self-pay | Admitting: Cardiology

## 2020-02-13 ENCOUNTER — Other Ambulatory Visit: Payer: Self-pay | Admitting: Cardiology

## 2020-04-16 ENCOUNTER — Other Ambulatory Visit: Payer: Self-pay

## 2020-04-16 MED ORDER — ATORVASTATIN CALCIUM 10 MG PO TABS: 10.0000 mg | ORAL_TABLET | Freq: Every day | ORAL | 0 refills | Status: DC

## 2020-04-24 ENCOUNTER — Other Ambulatory Visit: Payer: Self-pay | Admitting: Cardiology

## 2020-07-03 ENCOUNTER — Other Ambulatory Visit: Payer: Self-pay | Admitting: Cardiology

## 2020-09-21 IMAGING — CT CT HEART SCORING
2 series · 16 of 20 positions shown, 18 images · non-contrast
Comparison: None.
COMPARISON: None.

Addendum:
EXAM:
OVER-READ INTERPRETATION  CT CHEST

The following report is an over-read performed by radiologist Dr.
Maruja Munro [REDACTED] on 08/31/2018. This
over-read does not include interpretation of cardiac or coronary
anatomy or pathology. The coronary calcium score/coronary CTA
interpretation by the cardiologist is attached.
CLINICAL DATA: Risk stratification
Coronary Calcium Score
TECHNIQUE: The patient was scanned on a Siemens Force scanner. Axial
non-contrast 3 mm slices were carried out through the heart. The
data set was analyzed on a dedicated work station and scored using
the Agatson method.

[Series 2: casc 3.0 i36f 2 bestdiast 68 % · axial · 0.39mm/px · z∈[-254,-146]mm · 8 of 48 slices shown, 10 images]
[im 6/48  vessel]
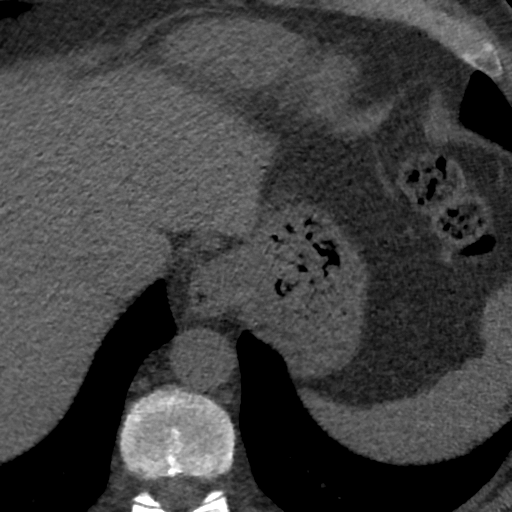
[im 6/48  lung]
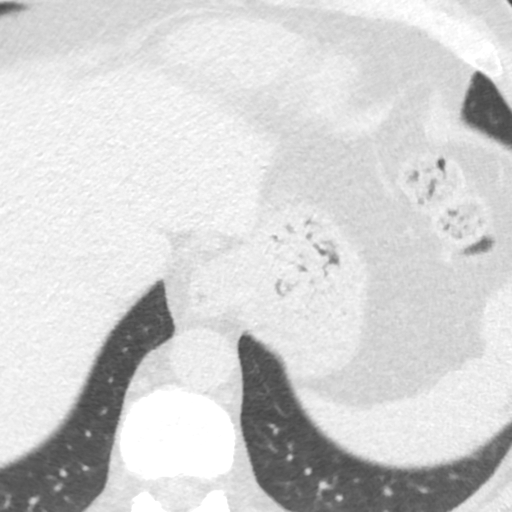
[im 11/48  vessel]
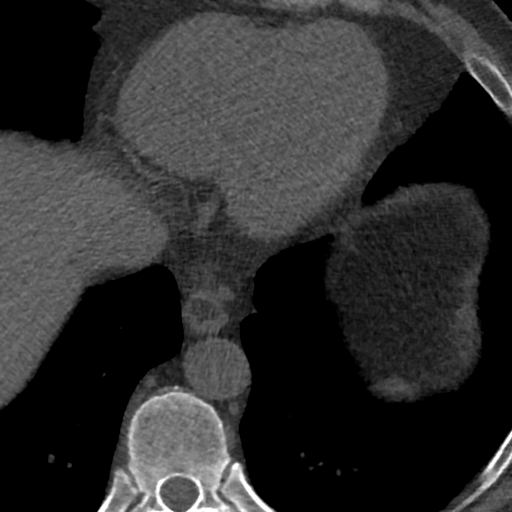
[im 16/48  vessel]
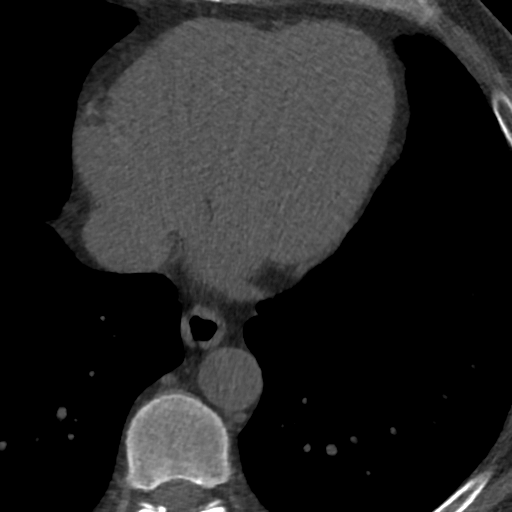
[im 21/48  vessel]
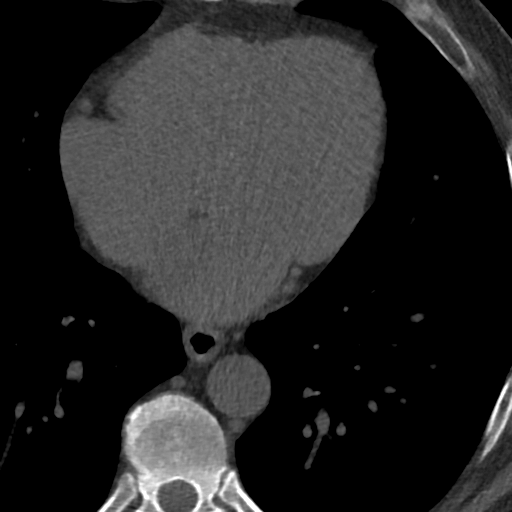
[im 27/48  vessel]
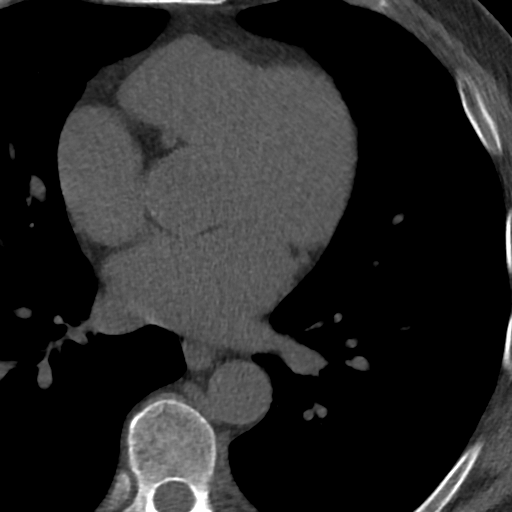
[im 27/48  lung]
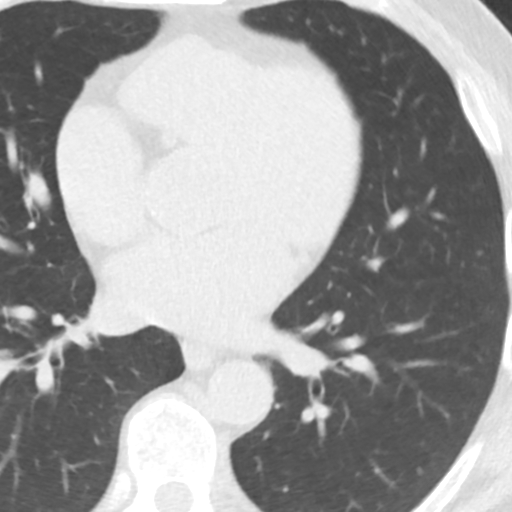
[im 32/48  vessel]
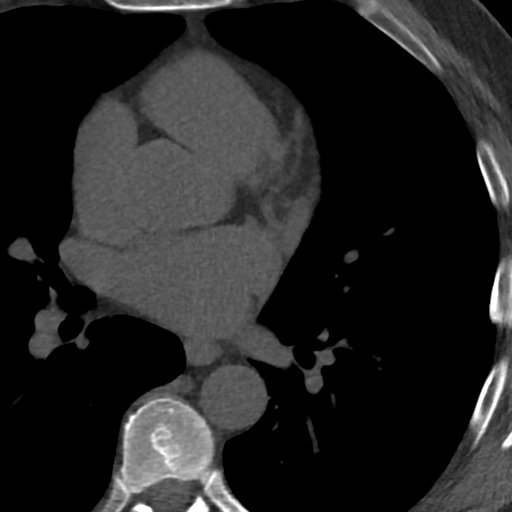
[im 37/48  vessel]
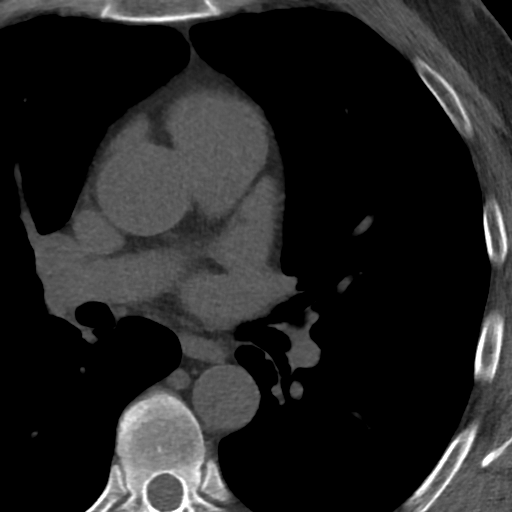
[im 42/48  vessel]
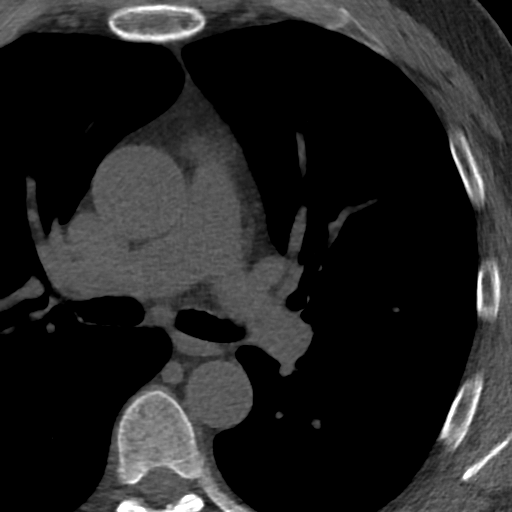

[Series 4: lung st 69 % · axial · 0.70mm/px · z∈[-254,-146]mm · 8 of 48 slices shown]
[im 6/48  lung]
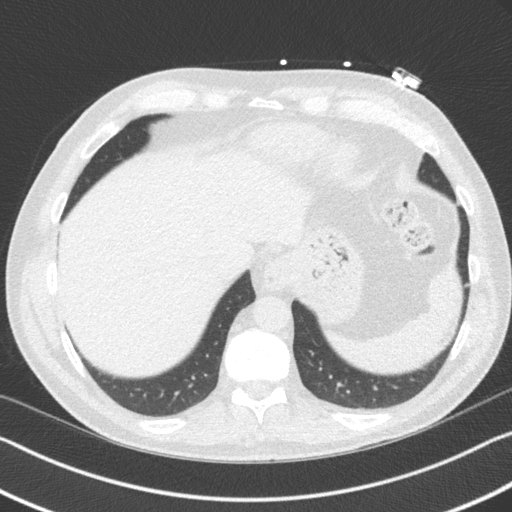
[im 11/48  lung]
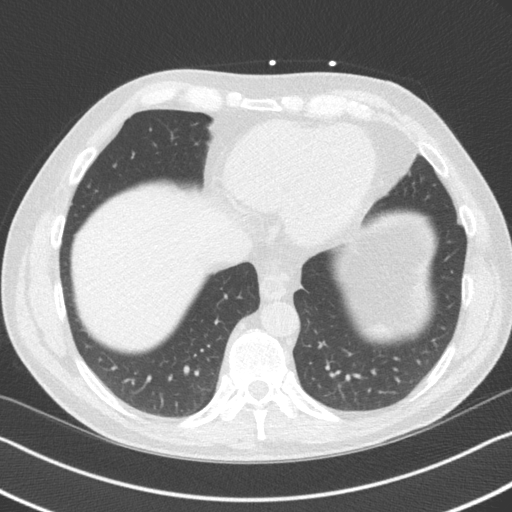
[im 16/48  lung]
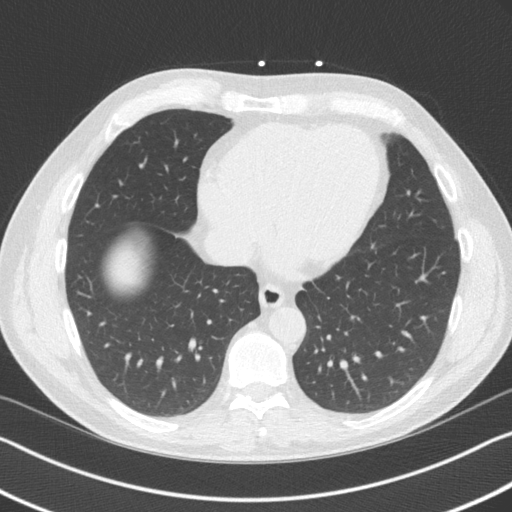
[im 21/48  lung]
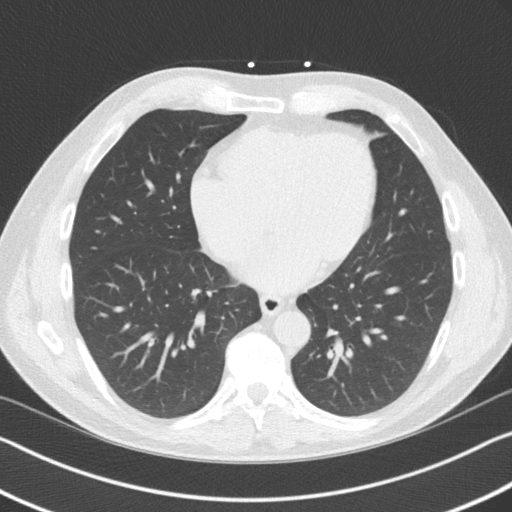
[im 27/48  lung]
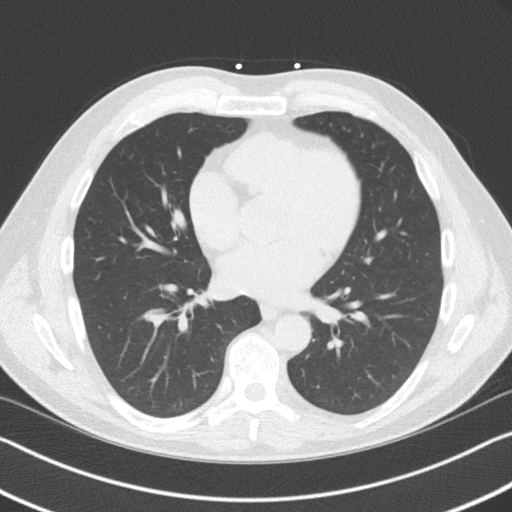
[im 32/48  lung]
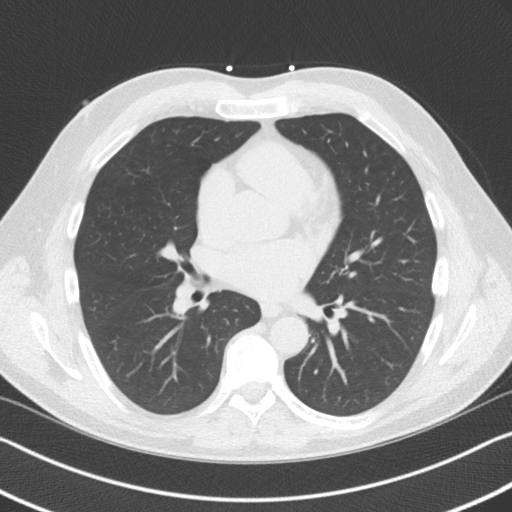
[im 37/48  lung]
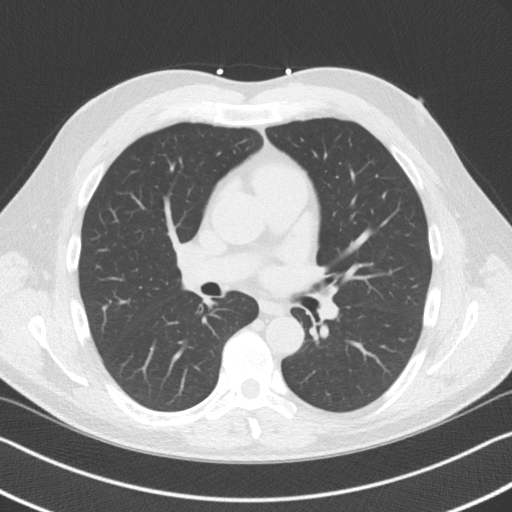
[im 42/48  lung]
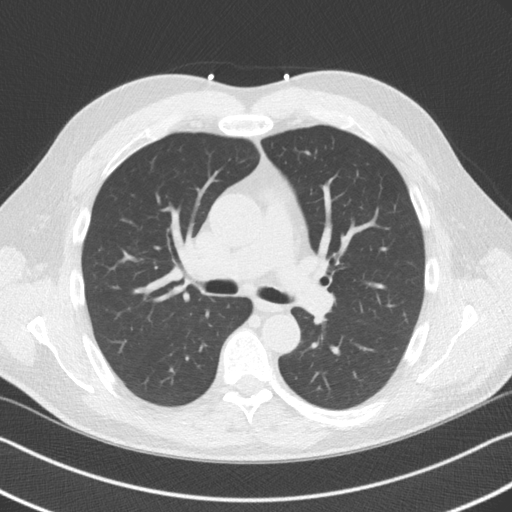

[16 of 20 positions shown; findings below may reference images not displayed]

FINDINGS: Within the visualized portions of the thorax there are no suspicious
appearing pulmonary nodules or masses, there is no acute
consolidative airspace disease, no pleural effusions, no
pneumothorax and no lymphadenopathy. Visualized portions of the
upper abdomen are unremarkable. There are no aggressive appearing
lytic or blastic lesions noted in the visualized portions of the
skeleton.
IMPRESSION: No significant incidental noncardiac findings are noted.
FINDINGS: Non-cardiac: See separate report from [REDACTED].

Ascending Aorta: Normal caliber.  Calcified aortic annulus.

Pericardium: Normal

Coronary arteries: Normal coronary origin with focal calcification
of the proximal LAD
IMPRESSION: Coronary calcium score of 214. This was 96th percentile for age and
sex matched control.

*** End of Addendum ***
EXAM:
OVER-READ INTERPRETATION  CT CHEST

The following report is an over-read performed by radiologist Dr.
Maruja Munro [REDACTED] on 08/31/2018. This
over-read does not include interpretation of cardiac or coronary
anatomy or pathology. The coronary calcium score/coronary CTA
interpretation by the cardiologist is attached.
FINDINGS: Within the visualized portions of the thorax there are no suspicious
appearing pulmonary nodules or masses, there is no acute
consolidative airspace disease, no pleural effusions, no
pneumothorax and no lymphadenopathy. Visualized portions of the
upper abdomen are unremarkable. There are no aggressive appearing
lytic or blastic lesions noted in the visualized portions of the
skeleton.
IMPRESSION: No significant incidental noncardiac findings are noted.

## 2020-10-02 ENCOUNTER — Other Ambulatory Visit: Payer: Self-pay | Admitting: Cardiology

## 2020-10-04 ENCOUNTER — Other Ambulatory Visit: Payer: Self-pay

## 2020-10-04 ENCOUNTER — Telehealth: Payer: Self-pay | Admitting: Cardiology

## 2020-10-04 MED ORDER — ATORVASTATIN CALCIUM 10 MG PO TABS: 10.0000 mg | ORAL_TABLET | Freq: Every day | ORAL | 0 refills | Status: DC

## 2020-10-04 NOTE — Telephone Encounter (Signed)
*  STAT* If patient is at the pharmacy, call can be transferred to refill team.   1. Which medications need to be refilled? (please list name of each medication and dose if known) *Atorvastatin  2. Which pharmacy/location (including street and city if local pharmacy) is medication to be sent to? [facility]  3. Do they need a 30 day or 90 day supply? Enough until his appt on 11-06-20

## 2020-10-15 MED ORDER — ATORVASTATIN CALCIUM 10 MG PO TABS: 10.0000 mg | ORAL_TABLET | Freq: Every day | ORAL | 0 refills | Status: DC

## 2020-11-06 ENCOUNTER — Encounter: Payer: Self-pay | Admitting: Cardiology

## 2020-11-06 ENCOUNTER — Other Ambulatory Visit: Payer: Self-pay

## 2020-11-06 ENCOUNTER — Ambulatory Visit: Payer: 59 | Admitting: Cardiology

## 2020-11-06 VITALS — BP 130/90 | HR 64 | Ht 68.0 in | Wt 199.2 lb

## 2020-11-06 DIAGNOSIS — R002 Palpitations: Secondary | ICD-10-CM | POA: Diagnosis not present

## 2020-11-06 DIAGNOSIS — E785 Hyperlipidemia, unspecified: Secondary | ICD-10-CM

## 2020-11-06 DIAGNOSIS — R0600 Dyspnea, unspecified: Secondary | ICD-10-CM

## 2020-11-06 DIAGNOSIS — I2510 Atherosclerotic heart disease of native coronary artery without angina pectoris: Secondary | ICD-10-CM | POA: Diagnosis not present

## 2020-11-06 DIAGNOSIS — R0609 Other forms of dyspnea: Secondary | ICD-10-CM

## 2020-11-06 DIAGNOSIS — R0789 Other chest pain: Secondary | ICD-10-CM

## 2020-11-06 MED ORDER — ATORVASTATIN CALCIUM 10 MG PO TABS: 10.0000 mg | ORAL_TABLET | Freq: Every day | ORAL | 3 refills | Status: DC

## 2020-11-06 NOTE — Progress Notes (Signed)
Cardiology Office Note   Date:  11/06/2020   ID:  David Ryan, DOB 26-Sep-1960, MRN [id number]  PCP:  David Dills, MD  Cardiologist:  David Ryan    Chief Complaint  Patient presents with   Palpitations       History of Present Illness: David Ryan is a 60 y.o. male who presents for FH of CAD, palpitations and today mild chest discomfort/SOB when starts walking.  He has a history of GERD who is referred due to a family hx of CAD to assess cardiac risk.  The patient has a history of palpitations in the past and apparently wore a heart monitor at one point but nothing really showed up.  He says over time his palpitations have gotten much better with treatment of anxiety and stress.  He limits caffeine use.  He denies any chest pain or pressure.  Occasionally he will have some mild shortness of breath when he first starts heavy exertion but within 10 minutes that resolves and he is able to continue with his heavy exertion or exercise.  He denies any PND, orthopnea, lower extremity edema, dizziness or syncope.  He does not smoke but was exposed to secondhand smoke with both his parents.  CT for calcium sore with evidence of coronary artery calcifications in the LAD with an elevated calcium score.  he was placed on statin.   He still has some palpatations and still with exertion he has chest heaviness and SOB but improves while he walks. Overall he felt better on the lipitor.  He did have COVID about 6-8 weks ago and felt he had flu. Was very tired.  Has now recovered.    Past Medical History:  Diagnosis Date   Cataracts, bilateral    Coronary artery calcification seen on CAT scan    coronary calcium score of 214 by CT 08/2018   Esophageal reflux    Family history of early CAD    Heart palpitations    Malaise    Nephrolithiasis    Recurrent epistaxis    Seasonal allergies    Special screening for malignant neoplasm of colon    Unspecified hemorrhoids      Past Surgical History:  Procedure Laterality Date   CHOLECYSTECTOMY     CYSTOSCOPY N/A    JOINT REPLACEMENT     left hip replacement  2005   right hip replacement Right 2002     Current Outpatient Medications  Medication Sig Dispense Refill   amoxicillin (AMOXIL) 500 MG capsule SMARTSIG:4 Capsule(s) By Mouth     guaiFENesin (MUCINEX) 600 MG 12 hr tablet Take by mouth 2 (two) times daily as needed for to loosen phlegm.     ibuprofen (ADVIL,MOTRIN) 200 MG tablet Take 200 mg by mouth every 6 (six) hours as needed for mild pain.     omeprazole (PRILOSEC) 20 MG capsule Take 20 mg by mouth as needed.      pseudoephedrine (SUDAFED) 30 MG tablet Take 30 mg by mouth every 4 (four) hours as needed for congestion.     atorvastatin (LIPITOR) 10 MG tablet Take 1 tablet (10 mg total) by mouth daily. 90 tablet 3   No current facility-administered medications for this visit.    Allergies:   Patient has no known allergies.    Social History:  The patient  reports that he has never smoked. He has never used smokeless tobacco. He reports current alcohol use. He reports that he does not use drugs.  Family History:  The patient's family history includes Coronary artery disease in his father; Emphysema in his mother; Healthy in his daughter and son; Heart disease in his father.    ROS:  General:no colds or fevers, no weight changes Skin:no rashes or ulcers HEENT:no blurred vision, no congestion CV:see HPI PUL:see HPI GI:no diarrhea constipation or melena, no indigestion GU:no hematuria, no dysuria MS:no joint pain, no claudication Neuro:no syncope, no lightheadedness Endo:no diabetes, no thyroid disease  Wt Readings from Last 3 Encounters:  11/06/20 199 lb 3.2 oz (90.4 kg)  08/31/18 189 lb (85.7 kg)  08/01/18 189 lb (85.7 kg)     PHYSICAL EXAM: VS:  BP 130/90   Pulse 64   Ht 5\' 8"  (1.727 m)   Wt 199 lb 3.2 oz (90.4 kg)   SpO2 98%   BMI 30.29 kg/m  , BMI Body mass index is 30.29  kg/m. General:Pleasant affect, NAD Skin:Warm and dry, brisk capillary refill HEENT:normocephalic, sclera clear, mucus membranes moist Neck:supple, no JVD, no bruits  Heart:S1S2 RRR without murmur, gallup, rub or click Lungs:clear without rales, rhonchi, or wheezes , non tender, + BS, do not palpate liver spleen or masses Ext:no lower ext edema, 2+ pedal pulses, 2+ radial pulses Neuro:alert and oriented, MAE, follows commands, + facial symmetry    EKG:  EKG is ordered today. The ekg ordered today demonstrates SR rate 64, T wave inversion in III otherwise no changes.    Recent Labs: No results found for requested labs within last 8760 hours.    Lipid Panel    Component Value Date/Time   CHOL 126 12/26/2018 1144   TRIG 43 12/26/2018 1144   HDL 62 12/26/2018 1144   CHOLHDL 2.0 12/26/2018 1144   LDLCALC 53 12/26/2018 1144       Other studies Reviewed: Additional studies/ records that were reviewed today include: . Cardiac CT scoring.  IMPRESSION: Coronary calcium score of 214. This was 96th percentile for age and sex matched control  Lexiscan 2020 no ischemia.   ASSESSMENT AND PLAN:  1.  Palpitations will recheck stress test, exercise, have asked him not to take pseudoephedrine may cause irregular HR.    2.  Elevated Ca+ score with focal calcification of proximal LAD. - still with chest pressure with walking in beginning will do POET to eval.  Last stress in 2020 was lexiscan due to COVID, so will see if he has hypertension in beginning of exercise or irregular HR.   If normal will have him follow up in 1 year with Dr. 2021.  3.  HLD will resume statin and check hepatic and lipid fasting.    Current medicines are reviewed with the patient today.  The patient Has no concerns regarding medicines.  The following changes have been made:  See above Labs/ tests ordered today include:see above  Disposition:   FU:  see above  Signed, David Knife, NP   11/06/2020 4:55 PM    [facility] [address] [place], [place], [place]  [place] [address] [address] [place], [place] Phone: [phone]; Fax: [phone]  [phone]

## 2020-11-06 NOTE — Patient Instructions (Signed)
Medication Instructions:  Your physician recommends that you continue on your current medications as directed. Please refer to the Current Medication list given to you today. *If you need a refill on your cardiac medications before your next appointment, please call your pharmacy*   Lab Work: When come for the Stress Test:  FASTING LIPID / LFT  If you have labs (blood work) drawn today and your tests are completely normal, you will receive your results only by: MyChart Message (if you have MyChart) OR A paper copy in the mail If you have any lab test that is abnormal or we need to change your treatment, we will call you to review the results.   Testing/Procedures: Your physician has requested that you have an exercise tolerance test. For further information please visit [email or web address] Please also follow instruction sheet, BELOW:  Do not eat or drink or use tobacco products 4 hours prior to the test Dress prepared to exercise in a 2 piece clothing outfit and walking shoes Bring any current prescription medications with you the day of the test Notify the office 24 hours advance if you have to cancel / reschedule If you have any questions, call [phone]    Follow-Up: At [facility], you and your health needs are our priority.  As part of our continuing mission to provide you with exceptional heart care, we have created designated Provider Care Teams.  These Care Teams include your primary Cardiologist (physician) and Advanced Practice Providers (APPs -  Physician Assistants and Nurse Practitioners) who all work together to provide you with the care you need, when you need it.  We recommend signing up for the patient portal called "MyChart".  Sign up information is provided on this After Visit Summary.  MyChart is used to connect with patients for Virtual Visits (Telemedicine).  Patients are able to view lab/test results, encounter notes, upcoming appointments, etc.  Non-urgent  messages can be sent to your provider as well.   To learn more about what you can do with MyChart, go to [email or web address].    Your next appointment:   12 month(s)  The format for your next appointment:   In Person  Provider:   You may see Dr. Mayford Knife or one of the following Advanced Practice Providers on your designated Care Team:   Ronie Spies, PA-C Jacolyn Reedy, PA-C   Other Instructions

## 2020-11-08 NOTE — Addendum Note (Signed)
Addended by: Leone Brand on: 11/08/2020 12:39 PM   Modules accepted: Orders

## 2020-11-14 ENCOUNTER — Other Ambulatory Visit: Payer: Self-pay

## 2020-11-14 ENCOUNTER — Ambulatory Visit (INDEPENDENT_AMBULATORY_CARE_PROVIDER_SITE_OTHER): Payer: 59

## 2020-11-14 ENCOUNTER — Other Ambulatory Visit: Payer: 59

## 2020-11-14 DIAGNOSIS — R0789 Other chest pain: Secondary | ICD-10-CM | POA: Diagnosis not present

## 2020-11-14 DIAGNOSIS — I2510 Atherosclerotic heart disease of native coronary artery without angina pectoris: Secondary | ICD-10-CM

## 2020-11-14 DIAGNOSIS — E785 Hyperlipidemia, unspecified: Secondary | ICD-10-CM

## 2020-11-14 DIAGNOSIS — R0609 Other forms of dyspnea: Secondary | ICD-10-CM

## 2020-11-14 DIAGNOSIS — R0600 Dyspnea, unspecified: Secondary | ICD-10-CM | POA: Diagnosis not present

## 2020-11-14 LAB — LIPID PANEL
Chol/HDL Ratio: 2.7 ratio (ref 0.0–5.0)
Cholesterol, Total: 126 mg/dL (ref 100–199)
HDL: 46 mg/dL (ref >39)
LDL Chol Calc (NIH): 66 mg/dL (ref 0–99)
Triglycerides: 64 mg/dL (ref 0–149)
VLDL Cholesterol Cal: 14 mg/dL (ref 5–40)

## 2020-11-14 LAB — EXERCISE TOLERANCE TEST
Estimated workload: 13.2 METS
Exercise duration (min): 11 min
Exercise duration (sec): 0 s
MPHR: 159 {beats}/min
Peak HR: 137 {beats}/min
Percent HR: 86 %
RPE: 18
Rest HR: 67 {beats}/min

## 2020-11-14 LAB — HEPATIC FUNCTION PANEL
ALT: 23 IU/L (ref 0–44)
AST: 22 IU/L (ref 0–40)
Albumin: 4.6 g/dL (ref 3.8–4.8)
Alkaline Phosphatase: 60 IU/L (ref 44–121)
Bilirubin Total: 0.7 mg/dL (ref 0.0–1.2)
Bilirubin, Direct: 0.2 mg/dL (ref 0.00–0.40)
Total Protein: 6.2 g/dL (ref 6.0–8.5)

## 2020-11-14 NOTE — Progress Notes (Signed)
Pt has been made aware of normal result and verbalized understanding.  jw

## 2022-01-30 ENCOUNTER — Other Ambulatory Visit: Payer: Self-pay | Admitting: Cardiology

## 2022-02-03 NOTE — Progress Notes (Unsigned)
Office Visit    Patient Name: David Ryan Date of Encounter: 02/03/2022  Primary Care Provider:  Renford Dills, MD Primary Cardiologist:  None Primary Electrophysiologist: None  Chief Complaint    David Ryan is a 61 y.o. 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  Past Medical History    Past Medical History:  Diagnosis Date   Cataracts, bilateral    Coronary artery calcification seen on CAT scan    coronary calcium score of 214 by CT 08/2018   Esophageal reflux    Family history of early CAD    Heart palpitations    Malaise    Nephrolithiasis    Recurrent epistaxis    Seasonal allergies    Special screening for malignant neoplasm of colon    Unspecified hemorrhoids    Past Surgical History:  Procedure Laterality Date   CHOLECYSTECTOMY     CYSTOSCOPY N/A    JOINT REPLACEMENT     left hip replacement  2005   right hip replacement Right 2002    Allergies  No Known Allergies  History of Present Illness    David Ryan  is a 61 year old male with the above mention past medical history who presents today for 1 year follow-up of palpitations and coronary artery disease.  Mr. Blankenburg was first seen by Dr. Mayford Knife on 07/2018 for complaint of palpitations and mild shortness of breath.  He was noted to have a family history of premature CAD.  He was sent for Lexiscan Myoview to stratify his risk of CAD.  Myoview results showed low risk study with no areas of ischemia.  Cardiac CT and calcium scoring was also completed that showed calcification in the LAD and score of 214.  He was started on Lipitor 10 mg at that time.  He was last seen on 10/2020 by Nada Boozer, NP for follow-up.  During visit patient had just recovered from COVID and was feeling some fatigue.  He noted palpitations and was sent for exercise treadmill due to elevated stratification.  Treadmill testing  results showed stable blood pressure during exercise with no significant arrhythmias.  Mr. Polgar presents today for annual follow-up alone.  Since last being seen in the office patient reports that he has been doing well with no new cardiac complaints.  He reports that his palpitations are still occurring however they are not associated with any additional symptoms such as dizziness or fatigue.  He states that vagal maneuver usually clears these palpitations when they do occur.  He is tolerating all of his medications as prescribed and denies any adverse reactions currently.  He is scheduled to have a physical with his PCP in February and will have his lipids checked at that time.  We discussed the pathophysiology of coronary disease and also discussed the results of his previous calcium scoring.  Patient had all questions answered to his satisfaction.  He stays active and currently walks 2 to 3 miles in the morning for exercise.  Patient denies chest pain, palpitations, dyspnea, PND, orthopnea, nausea, vomiting, dizziness, syncope, edema, weight gain, or early satiety.  Home Medications    Current Outpatient Medications  Medication Sig Dispense Refill   amoxicillin (AMOXIL) 500 MG capsule SMARTSIG:4 Capsule(s) By Mouth     atorvastatin (LIPITOR) 10 MG tablet Take 1 tablet (10 mg total) by mouth daily. Please contact the office to schedule appointment for additional refills. 1st  Attempt. 30 tablet 0   guaiFENesin (MUCINEX) 600 MG 12 hr tablet Take by mouth 2 (two) times daily as needed for to loosen phlegm.     ibuprofen (ADVIL,MOTRIN) 200 MG tablet Take 200 mg by mouth every 6 (six) hours as needed for mild pain.     omeprazole (PRILOSEC) 20 MG capsule Take 20 mg by mouth as needed.      pseudoephedrine (SUDAFED) 30 MG tablet Take 30 mg by mouth every 4 (four) hours as needed for congestion.     No current facility-administered medications for this visit.     Review of Systems  Please see the  history of present illness.    (+) Some deconditioning with physical activity (+) Occasional palpitations  All other systems reviewed and are otherwise negative except as noted above.  Physical Exam    Wt Readings from Last 3 Encounters:  11/06/20 199 lb 3.2 oz (90.4 kg)  08/31/18 189 lb (85.7 kg)  08/01/18 189 lb (85.7 kg)   QV:ZDGLO were no vitals filed for this visit.,There is no height or weight on file to calculate BMI.  Constitutional:      Appearance: Healthy appearance. Not in distress.  Neck:     Vascular: JVD normal.  Pulmonary:     Effort: Pulmonary effort is normal.     Breath sounds: No wheezing. No rales. Diminished in the bases Cardiovascular:     Normal rate. Regular rhythm. Normal S1. Normal S2.      Murmurs: There is no murmur.  Edema:    Peripheral edema absent.  Abdominal:     Palpations: Abdomen is soft non tender. There is no hepatomegaly.  Skin:    General: Skin is warm and dry.  Neurological:     General: No focal deficit present.     Mental Status: Alert and oriented to person, place and time.     Cranial Nerves: Cranial nerves are intact.  EKG/LABS/Other Studies Reviewed    ECG personally reviewed by me today -sinus rhythm with rate of 72 and no acute changes consistent with previous EKG.     Lab Results  Component Value Date   WBC 10.7 (H) 05/17/2013   HGB 16.0 05/17/2013   HCT 45.2 05/17/2013   MCV 90.4 05/17/2013   PLT 236 05/17/2013   Lab Results  Component Value Date   CREATININE 1.39 (H) 05/17/2013   BUN 21 05/17/2013   NA 143 05/17/2013   K 4.5 05/17/2013   CL 106 05/17/2013   CO2 23 05/17/2013   Lab Results  Component Value Date   ALT 23 11/14/2020   AST 22 11/14/2020   ALKPHOS 60 11/14/2020   BILITOT 0.7 11/14/2020   Lab Results  Component Value Date   CHOL 126 11/14/2020   HDL 46 11/14/2020   LDLCALC 66 11/14/2020   TRIG 64 11/14/2020   CHOLHDL 2.7 11/14/2020    No results found for: "HGBA1C"  Assessment &  Plan    1.  Nonobstructive CAD: -Patient had coronary calcification seen on CT in 2020 and underwent normal ETT and 2022 -Today he reports no chest pain or discomfort with physical activity -Continue atorvastatin 10 mg and primary prevention lifestyle modifications  2.  Palpitations: -Today patient reports continued occurrence with no associated symptoms.  -We discussed possibility of wearing event monitor in the future if frequency and symptoms increase with occurrence.  3.  Hyperlipidemia: -Patient's last LDL was 66 on 10/2020 -He will need repeat lipids and LFTs at  upcoming physical with Dr. Nehemiah Settle. -For now continue atorvastatin as noted above  Disposition: Follow-up with None or APP in 12 months    Medication Adjustments/Labs and Tests Ordered: Current medicines are reviewed at length with the patient today.  Concerns regarding medicines are outlined above.   Signed, Napoleon Form, Leodis Rains, NP 02/03/2022, 6:54 AM [facility]  Note:  This document was prepared using Dragon voice recognition software and may include unintentional dictation errors.

## 2022-02-05 ENCOUNTER — Ambulatory Visit: Payer: 59 | Attending: Nurse Practitioner | Admitting: Nurse Practitioner

## 2022-02-05 ENCOUNTER — Encounter: Payer: Self-pay | Admitting: Nurse Practitioner

## 2022-02-05 VITALS — BP 122/84 | HR 72 | Ht 68.0 in | Wt 200.0 lb

## 2022-02-05 DIAGNOSIS — R002 Palpitations: Secondary | ICD-10-CM

## 2022-02-05 DIAGNOSIS — I2510 Atherosclerotic heart disease of native coronary artery without angina pectoris: Secondary | ICD-10-CM

## 2022-02-05 DIAGNOSIS — E785 Hyperlipidemia, unspecified: Secondary | ICD-10-CM | POA: Diagnosis not present

## 2022-02-05 NOTE — Patient Instructions (Signed)
Medication Instructions:  Your physician recommends that you continue on your current medications as directed. Please refer to the Current Medication list given to you today. *If you need a refill on your cardiac medications before your next appointment, please call your pharmacy*   Lab Work: None Ordered    Testing/Procedures: None Ordered   Follow-Up: At [facility], you and your health needs are our priority.  As part of our continuing mission to provide you with exceptional heart care, we have created designated Provider Care Teams.  These Care Teams include your primary Cardiologist (physician) and Advanced Practice Providers (APPs -  Physician Assistants and Nurse Practitioners) who all work together to provide you with the care you need, when you need it.  We recommend signing up for the patient portal called "MyChart".  Sign up information is provided on this After Visit Summary.  MyChart is used to connect with patients for Virtual Visits (Telemedicine).  Patients are able to view lab/test results, encounter notes, upcoming appointments, etc.  Non-urgent messages can be sent to your provider as well.   To learn more about what you can do with MyChart, go to [email or web address].    Your next appointment:   12 month(s)  The format for your next appointment:   In Person  Provider:   Quintella Reichert, MD  Other Instructions   Important Information About Sugar

## 2022-02-24 ENCOUNTER — Other Ambulatory Visit: Payer: Self-pay | Admitting: Cardiology

## 2022-03-09 ENCOUNTER — Other Ambulatory Visit: Payer: Self-pay | Admitting: Cardiology

## 2022-03-09 MED ORDER — ATORVASTATIN CALCIUM 10 MG PO TABS: 10.0000 mg | ORAL_TABLET | Freq: Every day | ORAL | 3 refills | Status: DC

## 2023-01-06 ENCOUNTER — Ambulatory Visit: Payer: 59 | Attending: Nurse Practitioner | Admitting: Cardiology

## 2023-01-06 ENCOUNTER — Encounter: Payer: Self-pay | Admitting: Cardiology

## 2023-01-06 VITALS — BP 130/82 | HR 73 | Wt 195.6 lb

## 2023-01-06 DIAGNOSIS — E785 Hyperlipidemia, unspecified: Secondary | ICD-10-CM | POA: Diagnosis not present

## 2023-01-06 DIAGNOSIS — R002 Palpitations: Secondary | ICD-10-CM | POA: Diagnosis not present

## 2023-01-06 DIAGNOSIS — I2510 Atherosclerotic heart disease of native coronary artery without angina pectoris: Secondary | ICD-10-CM

## 2023-01-06 DIAGNOSIS — Z79899 Other long term (current) drug therapy: Secondary | ICD-10-CM

## 2023-01-06 MED ORDER — ASPIRIN 81 MG PO TBEC
81.0000 mg | DELAYED_RELEASE_TABLET | Freq: Every day | ORAL | 3 refills | Status: DC
Start: 2023-01-06 — End: 2023-01-06

## 2023-01-06 MED ORDER — ASPIRIN 81 MG PO TBEC
81.0000 mg | DELAYED_RELEASE_TABLET | Freq: Every day | ORAL | 3 refills | Status: AC
Start: 2023-01-06 — End: ?

## 2023-01-06 NOTE — Addendum Note (Signed)
Addended by: Luellen Pucker on: 01/06/2023 11:14 AM   Modules accepted: Orders

## 2023-01-06 NOTE — Addendum Note (Signed)
Addended by: Rexene Edison L on: 01/06/2023 11:06 AM   Modules accepted: Orders

## 2023-01-06 NOTE — Progress Notes (Signed)
Office Visit    Patient Name: David Ryan Date of Encounter: 01/06/2023  Primary Care Provider:  Renford Dills, MD Primary Cardiologist:  None Primary Electrophysiologist: None  Chief Complaint    David Ryan is a 62 y.o. 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  Past Medical History    Past Medical History:  Diagnosis Date   Cataracts, bilateral    Coronary artery calcification seen on CAT scan    coronary calcium score of 214 by CT 08/2018   Esophageal reflux    Family history of early CAD    Heart palpitations    Malaise    Nephrolithiasis    Recurrent epistaxis    Seasonal allergies    Special screening for malignant neoplasm of colon    Unspecified hemorrhoids    Past Surgical History:  Procedure Laterality Date   CHOLECYSTECTOMY     CYSTOSCOPY N/A    JOINT REPLACEMENT     left hip replacement  2005   right hip replacement Right 2002    Allergies  No Known Allergies  History of Present Illness    David Ryan  is a 62 year old male with coronary artery calcification in the LAD and score of 214.  He also has a hx of palpitations.  he is here today for followup and is doing well.  He denies any chest pain or pressure, SOB, DOE, PND, orthopnea, LE edema, dizziness or syncope.   He occasionally will have a flip flop in his heart beat but not bothersome and hardly notices it. He is compliant with his meds and is tolerating meds with no SE.    Home Medications    Current Outpatient Medications  Medication Sig Dispense Refill   atorvastatin (LIPITOR) 10 MG tablet Take 1 tablet (10 mg total) by mouth daily. Please contact the office to schedule appointment for additional refills. 2 Attempt 90 tablet 3   guaiFENesin (MUCINEX) 600 MG 12 hr tablet Take by mouth 2 (two) times daily as needed for to loosen phlegm.     ibuprofen (ADVIL,MOTRIN) 200 MG tablet  Take 200 mg by mouth every 6 (six) hours as needed for mild pain.     omeprazole (PRILOSEC) 20 MG capsule Take 20 mg by mouth as needed.      pseudoephedrine (SUDAFED) 30 MG tablet Take 30 mg by mouth every 4 (four) hours as needed for congestion.     No current facility-administered medications for this visit.     Review of Systems  Please see the history of present illness.    (+) Some deconditioning with physical activity (+) Occasional palpitations  All other systems reviewed and are otherwise negative except as noted above.  Physical Exam    Wt Readings from Last 3 Encounters:  01/06/23 195 lb 9.6 oz (88.7 kg)  02/05/22 200 lb (90.7 kg)  11/06/20 199 lb 3.2 oz (90.4 kg)   VS: Vitals:   01/06/23 1047  BP: 130/82  Pulse: 73  SpO2: 98%  ,Body mass index is 29.74 kg/m.  GEN: Well nourished, well developed in no acute distress HEENT: Normal NECK: No JVD; No carotid bruits LYMPHATICS: No lymphadenopathy CARDIAC:RRR, no murmurs, rubs, gallops RESPIRATORY:  Clear to auscultation without rales, wheezing or rhonchi  ABDOMEN: Soft, non-tender, non-distended MUSCULOSKELETAL:  No edema; No deformity  SKIN: Warm and dry NEUROLOGIC:  Alert and oriented x 3 PSYCHIATRIC:  Normal affect  EKG/LABS/Other Studies Reviewed    EKG Interpretation Date/Time:  Wednesday January 06 2023 10:50:10 EDT Ventricular Rate:  73 PR Interval:  178 QRS Duration:  94 QT Interval:  372 QTC Calculation: 409 R Axis:   100  Text Interpretation: Normal sinus rhythm Rightward axis When compared with ECG of 28-Nov-2008 22:43, Vent. rate has increased BY  27 BPM Confirmed by Armanda Magic ([id number]) on 01/06/2023 10:54:47 AM   Lab Results  Component Value Date   WBC 10.7 (H) 05/17/2013   HGB 16.0 05/17/2013   HCT 45.2 05/17/2013   MCV 90.4 05/17/2013   PLT 236 05/17/2013   Lab Results  Component Value Date   CREATININE 1.39 (H) 05/17/2013   BUN 21 05/17/2013   NA 143 05/17/2013   K 4.5  05/17/2013   CL 106 05/17/2013   CO2 23 05/17/2013   Lab Results  Component Value Date   ALT 23 11/14/2020   AST 22 11/14/2020   ALKPHOS 60 11/14/2020   BILITOT 0.7 11/14/2020   Lab Results  Component Value Date   CHOL 126 11/14/2020   HDL 46 11/14/2020   LDLCALC 66 11/14/2020   TRIG 64 11/14/2020   CHOLHDL 2.7 11/14/2020    No results found for: "HGBA1C"  Assessment & Plan    1.  Nonobstructive CAD: -Patient had coronary calcification seen on CT in 2020 with coronary Ca score 219  -normal ETT in 2022 -he has not had any CP or SOB since he was last seen -continue prescription drug management with Atorvastatin 10mg  daily with PRN refills -I encouraged him to take an 81mg  ASA daily -check ETT to rule out ischemia -Informed Consent   Shared Decision Making/Informed Consent The risks [chest pain, shortness of breath, cardiac arrhythmias, dizziness, blood pressure fluctuations, myocardial infarction, stroke/transient ischemic attack, and life-threatening complications (estimated to be 1 in 10,000)], benefits (risk stratification, diagnosing coronary artery disease, treatment guidance) and alternatives of an exercise tolerance test were discussed in detail with Mr. Makki and he agrees to proceed.    2.  Palpitations: -these are very rare in occurrence  3.  Hyperlipidemia: -LDL goal < 70 -check FLP and ALT when fasting -continue prescription drug management with Atorvastatin 10mg  daily with PRN refills  Disposition: 1 year    Medication Adjustments/Labs and Tests Ordered: Current medicines are reviewed at length with the patient today.  Concerns regarding medicines are outlined above.   Signed, Armanda Magic, NP 01/06/2023, 10:56 AM [facility]

## 2023-01-06 NOTE — Patient Instructions (Addendum)
Medication Instructions:  Please START taking 81 mg aspirin daily.  This medication is available over the counter.  *If you need a refill on your cardiac medications before your next appointment, please call your pharmacy*   Lab Work: Please complete a FASTING lipid panel and an ALT in our lab today before you leave.  If you have labs (blood work) drawn today and your tests are completely normal, you will receive your results only by: MyChart Message (if you have MyChart) OR A paper copy in the mail If you have any lab test that is abnormal or we need to change your treatment, we will call you to review the results.   Testing/Procedures: Your doctor has ordered an exercise tolerance test for you. Please wear shoes and clothes that are comfortable for walking/moving around.   Follow-Up:  Your next appointment:   1 year(s)  Provider:   Dr. Armanda Magic, MD

## 2023-01-06 NOTE — Addendum Note (Signed)
Addended by: Rexene Edison L on: 01/06/2023 11:15 AM   Modules accepted: Orders

## 2023-01-07 ENCOUNTER — Ambulatory Visit: Payer: 59 | Attending: Cardiology

## 2023-01-07 DIAGNOSIS — R002 Palpitations: Secondary | ICD-10-CM | POA: Diagnosis not present

## 2023-01-07 DIAGNOSIS — I2510 Atherosclerotic heart disease of native coronary artery without angina pectoris: Secondary | ICD-10-CM | POA: Diagnosis not present

## 2023-01-07 LAB — EXERCISE TOLERANCE TEST
Angina Index: 0
Duke Treadmill Score: 13
Estimated workload: 14.3
Exercise duration (min): 12 min
Exercise duration (sec): 30 s
MPHR: 157 {beats}/min
Peak HR: 136 {beats}/min
Percent HR: 86 %
RPE: 17
Rest HR: 67 {beats}/min
ST Depression (mm): 0 mm

## 2023-01-07 LAB — LIPID PANEL
Chol/HDL Ratio: 2.4 {ratio} (ref 0.0–5.0)
Cholesterol, Total: 146 mg/dL (ref 100–199)
HDL: 62 mg/dL (ref >39)
LDL Chol Calc (NIH): 71 mg/dL (ref 0–99)
Triglycerides: 67 mg/dL (ref 0–149)
VLDL Cholesterol Cal: 13 mg/dL (ref 5–40)

## 2023-01-07 LAB — ALT: ALT: 20 [IU]/L (ref 0–44)

## 2023-01-08 ENCOUNTER — Telehealth: Payer: Self-pay

## 2023-01-08 NOTE — Telephone Encounter (Signed)
-----   Message from Armanda Magic sent at 01/07/2023  8:18 PM EDT ----- Lipids at goal continue current therapy and forward to PCP

## 2023-01-08 NOTE — Telephone Encounter (Signed)
-----   Message from Armanda Magic sent at 01/07/2023  8:28 PM EDT ----- Please let patient know that stress test looked good.  The BP did get high during the stress test so I would like him to check his BP twice daily for a week and call with results. If he does not have a BP monitor then get a 24hour BP monitor

## 2023-01-08 NOTE — Telephone Encounter (Signed)
Per Dr. Mayford Knife, call to advise lipids at goal, patient verbalizes understanding to continue current therapy. Labs forwarded to PCP.

## 2023-01-08 NOTE — Telephone Encounter (Signed)
Call to patient to advise that stress test looked good but elevated BP was noted. Patient agrees to check BP BID and call our office with results after one week.

## 2023-01-13 NOTE — Addendum Note (Signed)
Addended by: Elizabeth Palau on: 01/13/2023 07:27 AM   Modules accepted: Orders

## 2023-01-15 ENCOUNTER — Encounter: Payer: Self-pay | Admitting: Cardiology

## 2023-01-18 ENCOUNTER — Telehealth: Payer: Self-pay

## 2023-01-18 NOTE — Telephone Encounter (Signed)
Call to patient to take down BP readings,no answer, unable to leave VM. Received message that VM box was full.

## 2023-01-18 NOTE — Telephone Encounter (Signed)
-----   Message from Nurse Alcario Drought E sent at 01/08/2023  9:52 AM EDT ----- Regarding: BP log BP log due 10/18

## 2023-01-21 ENCOUNTER — Telehealth: Payer: Self-pay

## 2023-01-21 NOTE — Telephone Encounter (Signed)
-----   Message from Nurse Alcario Drought E sent at 01/08/2023  9:52 AM EDT ----- Regarding: BP log BP log due 10/18

## 2023-01-21 NOTE — Telephone Encounter (Signed)
Call to follow up on BP log, no answer, unable to leave message.

## 2023-01-28 ENCOUNTER — Telehealth: Payer: Self-pay

## 2023-01-28 NOTE — Telephone Encounter (Signed)
Call to patient to request BP log. Patient states he has been doing it but does not have it on him at this time. He states he will submit over Mychart when he gets a chance.

## 2023-01-28 NOTE — Telephone Encounter (Signed)
-----   Message from Nurse Alcario Drought E sent at 01/08/2023  9:52 AM EDT ----- Regarding: BP log BP log due 10/18

## 2023-02-04 ENCOUNTER — Telehealth: Payer: Self-pay

## 2023-02-04 NOTE — Telephone Encounter (Signed)
On review of records, we have attempted to contact this regarding his BP log many times:  [facility] 01/18/23, 01/21/23, 01/28/23.   Letter sent.

## 2023-02-04 NOTE — Telephone Encounter (Signed)
Call to request BP log, VM picked up but unable to leave a message as mail box was full.

## 2023-03-03 ENCOUNTER — Ambulatory Visit: Payer: 59 | Admitting: Nurse Practitioner

## 2023-03-09 NOTE — Telephone Encounter (Signed)
See chart, letter sent 02/04/23.

## 2023-03-10 ENCOUNTER — Ambulatory Visit: Payer: 59 | Admitting: Nurse Practitioner

## 2023-04-08 DIAGNOSIS — M9901 Segmental and somatic dysfunction of cervical region: Secondary | ICD-10-CM | POA: Diagnosis not present

## 2023-05-06 DIAGNOSIS — M9901 Segmental and somatic dysfunction of cervical region: Secondary | ICD-10-CM | POA: Diagnosis not present

## 2023-05-20 DIAGNOSIS — M9901 Segmental and somatic dysfunction of cervical region: Secondary | ICD-10-CM | POA: Diagnosis not present

## 2023-06-10 DIAGNOSIS — M9901 Segmental and somatic dysfunction of cervical region: Secondary | ICD-10-CM | POA: Diagnosis not present

## 2023-06-14 ENCOUNTER — Other Ambulatory Visit: Payer: Self-pay | Admitting: Cardiology

## 2023-06-17 DIAGNOSIS — M9901 Segmental and somatic dysfunction of cervical region: Secondary | ICD-10-CM | POA: Diagnosis not present

## 2023-07-06 DIAGNOSIS — M9903 Segmental and somatic dysfunction of lumbar region: Secondary | ICD-10-CM | POA: Diagnosis not present

## 2023-07-06 DIAGNOSIS — Z125 Encounter for screening for malignant neoplasm of prostate: Secondary | ICD-10-CM | POA: Diagnosis not present

## 2023-07-06 DIAGNOSIS — M9902 Segmental and somatic dysfunction of thoracic region: Secondary | ICD-10-CM | POA: Diagnosis not present

## 2023-07-06 DIAGNOSIS — Z0000 Encounter for general adult medical examination without abnormal findings: Secondary | ICD-10-CM | POA: Diagnosis not present

## 2023-07-06 DIAGNOSIS — M9901 Segmental and somatic dysfunction of cervical region: Secondary | ICD-10-CM | POA: Diagnosis not present

## 2023-07-06 DIAGNOSIS — M9904 Segmental and somatic dysfunction of sacral region: Secondary | ICD-10-CM | POA: Diagnosis not present

## 2023-07-06 DIAGNOSIS — I2510 Atherosclerotic heart disease of native coronary artery without angina pectoris: Secondary | ICD-10-CM | POA: Diagnosis not present

## 2023-07-15 DIAGNOSIS — M9901 Segmental and somatic dysfunction of cervical region: Secondary | ICD-10-CM | POA: Diagnosis not present

## 2023-07-20 DIAGNOSIS — M9904 Segmental and somatic dysfunction of sacral region: Secondary | ICD-10-CM | POA: Diagnosis not present

## 2023-07-20 DIAGNOSIS — M9903 Segmental and somatic dysfunction of lumbar region: Secondary | ICD-10-CM | POA: Diagnosis not present

## 2023-07-20 DIAGNOSIS — M9901 Segmental and somatic dysfunction of cervical region: Secondary | ICD-10-CM | POA: Diagnosis not present

## 2023-07-20 DIAGNOSIS — M9902 Segmental and somatic dysfunction of thoracic region: Secondary | ICD-10-CM | POA: Diagnosis not present

## 2023-07-29 DIAGNOSIS — M9901 Segmental and somatic dysfunction of cervical region: Secondary | ICD-10-CM | POA: Diagnosis not present

## 2023-08-03 DIAGNOSIS — M542 Cervicalgia: Secondary | ICD-10-CM | POA: Diagnosis not present

## 2023-08-03 DIAGNOSIS — M545 Low back pain, unspecified: Secondary | ICD-10-CM | POA: Diagnosis not present

## 2023-08-03 DIAGNOSIS — M9901 Segmental and somatic dysfunction of cervical region: Secondary | ICD-10-CM | POA: Diagnosis not present

## 2023-08-03 DIAGNOSIS — M9902 Segmental and somatic dysfunction of thoracic region: Secondary | ICD-10-CM | POA: Diagnosis not present

## 2023-08-16 DIAGNOSIS — H40033 Anatomical narrow angle, bilateral: Secondary | ICD-10-CM | POA: Diagnosis not present

## 2023-08-16 DIAGNOSIS — H5213 Myopia, bilateral: Secondary | ICD-10-CM | POA: Diagnosis not present

## 2023-08-16 DIAGNOSIS — H2513 Age-related nuclear cataract, bilateral: Secondary | ICD-10-CM | POA: Diagnosis not present

## 2023-08-16 DIAGNOSIS — H52223 Regular astigmatism, bilateral: Secondary | ICD-10-CM | POA: Diagnosis not present

## 2023-08-16 DIAGNOSIS — H04123 Dry eye syndrome of bilateral lacrimal glands: Secondary | ICD-10-CM | POA: Diagnosis not present

## 2023-08-16 DIAGNOSIS — H524 Presbyopia: Secondary | ICD-10-CM | POA: Diagnosis not present

## 2023-08-17 DIAGNOSIS — M545 Low back pain, unspecified: Secondary | ICD-10-CM | POA: Diagnosis not present

## 2023-08-17 DIAGNOSIS — M9901 Segmental and somatic dysfunction of cervical region: Secondary | ICD-10-CM | POA: Diagnosis not present

## 2023-08-17 DIAGNOSIS — M9902 Segmental and somatic dysfunction of thoracic region: Secondary | ICD-10-CM | POA: Diagnosis not present

## 2023-08-17 DIAGNOSIS — M542 Cervicalgia: Secondary | ICD-10-CM | POA: Diagnosis not present

## 2023-08-31 DIAGNOSIS — M9901 Segmental and somatic dysfunction of cervical region: Secondary | ICD-10-CM | POA: Diagnosis not present

## 2023-08-31 DIAGNOSIS — M542 Cervicalgia: Secondary | ICD-10-CM | POA: Diagnosis not present

## 2023-08-31 DIAGNOSIS — M9902 Segmental and somatic dysfunction of thoracic region: Secondary | ICD-10-CM | POA: Diagnosis not present

## 2023-08-31 DIAGNOSIS — M545 Low back pain, unspecified: Secondary | ICD-10-CM | POA: Diagnosis not present

## 2023-09-07 DIAGNOSIS — M9901 Segmental and somatic dysfunction of cervical region: Secondary | ICD-10-CM | POA: Diagnosis not present

## 2023-09-07 DIAGNOSIS — M9902 Segmental and somatic dysfunction of thoracic region: Secondary | ICD-10-CM | POA: Diagnosis not present

## 2023-09-07 DIAGNOSIS — M545 Low back pain, unspecified: Secondary | ICD-10-CM | POA: Diagnosis not present

## 2023-09-07 DIAGNOSIS — M542 Cervicalgia: Secondary | ICD-10-CM | POA: Diagnosis not present

## 2023-10-12 DIAGNOSIS — M545 Low back pain, unspecified: Secondary | ICD-10-CM | POA: Diagnosis not present

## 2023-10-12 DIAGNOSIS — M9901 Segmental and somatic dysfunction of cervical region: Secondary | ICD-10-CM | POA: Diagnosis not present

## 2023-10-12 DIAGNOSIS — M542 Cervicalgia: Secondary | ICD-10-CM | POA: Diagnosis not present

## 2023-10-12 DIAGNOSIS — M9902 Segmental and somatic dysfunction of thoracic region: Secondary | ICD-10-CM | POA: Diagnosis not present

## 2023-11-02 DIAGNOSIS — M9902 Segmental and somatic dysfunction of thoracic region: Secondary | ICD-10-CM | POA: Diagnosis not present

## 2023-11-02 DIAGNOSIS — M542 Cervicalgia: Secondary | ICD-10-CM | POA: Diagnosis not present

## 2023-11-02 DIAGNOSIS — M9901 Segmental and somatic dysfunction of cervical region: Secondary | ICD-10-CM | POA: Diagnosis not present

## 2023-11-02 DIAGNOSIS — M545 Low back pain, unspecified: Secondary | ICD-10-CM | POA: Diagnosis not present

## 2023-11-16 DIAGNOSIS — M9902 Segmental and somatic dysfunction of thoracic region: Secondary | ICD-10-CM | POA: Diagnosis not present

## 2023-11-16 DIAGNOSIS — M542 Cervicalgia: Secondary | ICD-10-CM | POA: Diagnosis not present

## 2023-11-16 DIAGNOSIS — M9901 Segmental and somatic dysfunction of cervical region: Secondary | ICD-10-CM | POA: Diagnosis not present

## 2023-11-16 DIAGNOSIS — M545 Low back pain, unspecified: Secondary | ICD-10-CM | POA: Diagnosis not present

## 2023-11-30 DIAGNOSIS — M542 Cervicalgia: Secondary | ICD-10-CM | POA: Diagnosis not present

## 2023-11-30 DIAGNOSIS — M9902 Segmental and somatic dysfunction of thoracic region: Secondary | ICD-10-CM | POA: Diagnosis not present

## 2023-11-30 DIAGNOSIS — M9901 Segmental and somatic dysfunction of cervical region: Secondary | ICD-10-CM | POA: Diagnosis not present

## 2023-11-30 DIAGNOSIS — M545 Low back pain, unspecified: Secondary | ICD-10-CM | POA: Diagnosis not present

## 2023-12-14 DIAGNOSIS — M9902 Segmental and somatic dysfunction of thoracic region: Secondary | ICD-10-CM | POA: Diagnosis not present

## 2023-12-14 DIAGNOSIS — M545 Low back pain, unspecified: Secondary | ICD-10-CM | POA: Diagnosis not present

## 2023-12-14 DIAGNOSIS — M542 Cervicalgia: Secondary | ICD-10-CM | POA: Diagnosis not present

## 2023-12-14 DIAGNOSIS — M9901 Segmental and somatic dysfunction of cervical region: Secondary | ICD-10-CM | POA: Diagnosis not present

## 2023-12-16 DIAGNOSIS — D485 Neoplasm of uncertain behavior of skin: Secondary | ICD-10-CM | POA: Diagnosis not present

## 2023-12-16 DIAGNOSIS — B078 Other viral warts: Secondary | ICD-10-CM | POA: Diagnosis not present

## 2024-01-26 DIAGNOSIS — H40033 Anatomical narrow angle, bilateral: Secondary | ICD-10-CM | POA: Diagnosis not present

## 2024-01-26 DIAGNOSIS — H40013 Open angle with borderline findings, low risk, bilateral: Secondary | ICD-10-CM | POA: Diagnosis not present

## 2024-01-26 DIAGNOSIS — H04123 Dry eye syndrome of bilateral lacrimal glands: Secondary | ICD-10-CM | POA: Diagnosis not present

## 2024-01-26 DIAGNOSIS — H2513 Age-related nuclear cataract, bilateral: Secondary | ICD-10-CM | POA: Diagnosis not present

## 2024-04-04 ENCOUNTER — Ambulatory Visit: Payer: Self-pay | Admitting: Cardiology

## 2024-04-28 ENCOUNTER — Other Ambulatory Visit: Payer: Self-pay | Admitting: Cardiology

## 2024-05-12 ENCOUNTER — Ambulatory Visit: Payer: Self-pay | Admitting: Emergency Medicine
